# Patient Record
Sex: Male | Born: 1960 | Race: White | Hispanic: No | Marital: Married | Smoking: Never smoker
Health system: Southern US, Community
[De-identification: ages and names within clinical notes are randomized; demographics above are authoritative.]

## PROBLEM LIST (undated history)

## (undated) DIAGNOSIS — I10 Essential (primary) hypertension: Secondary | ICD-10-CM

## (undated) DIAGNOSIS — I2510 Atherosclerotic heart disease of native coronary artery without angina pectoris: Secondary | ICD-10-CM

## (undated) DIAGNOSIS — T7840XA Allergy, unspecified, initial encounter: Secondary | ICD-10-CM

## (undated) DIAGNOSIS — S62609A Fracture of unspecified phalanx of unspecified finger, initial encounter for closed fracture: Secondary | ICD-10-CM

## (undated) DIAGNOSIS — E785 Hyperlipidemia, unspecified: Secondary | ICD-10-CM

## (undated) HISTORY — PX: VASECTOMY: SHX75

## (undated) HISTORY — DX: Essential (primary) hypertension: I10

## (undated) HISTORY — DX: Atherosclerotic heart disease of native coronary artery without angina pectoris: I25.10

## (undated) HISTORY — DX: Allergy, unspecified, initial encounter: T78.40XA

## (undated) HISTORY — DX: Fracture of unspecified phalanx of unspecified finger, initial encounter for closed fracture: S62.609A

## (undated) HISTORY — PX: INGUINAL HERNIA REPAIR: SUR1180

## (undated) HISTORY — PX: COLONOSCOPY: SHX174

## (undated) HISTORY — DX: Hyperlipidemia, unspecified: E78.5

---

## 2012-10-05 ENCOUNTER — Encounter: Payer: Self-pay | Admitting: Internal Medicine

## 2012-11-30 ENCOUNTER — Ambulatory Visit (AMBULATORY_SURGERY_CENTER): Payer: Self-pay | Admitting: *Deleted

## 2012-11-30 VITALS — Ht 72.0 in | Wt 191.0 lb

## 2012-11-30 DIAGNOSIS — Z1211 Encounter for screening for malignant neoplasm of colon: Secondary | ICD-10-CM

## 2012-11-30 MED ORDER — NA SULFATE-K SULFATE-MG SULF 17.5-3.13-1.6 GM/177ML PO SOLN: ORAL | Status: DC

## 2012-11-30 NOTE — Progress Notes (Signed)
No egg or soy allergy 

## 2012-12-03 ENCOUNTER — Encounter: Payer: Self-pay | Admitting: Internal Medicine

## 2012-12-14 ENCOUNTER — Ambulatory Visit (AMBULATORY_SURGERY_CENTER): Payer: PRIVATE HEALTH INSURANCE | Admitting: Internal Medicine

## 2012-12-14 ENCOUNTER — Encounter: Payer: Self-pay | Admitting: Internal Medicine

## 2012-12-14 VITALS — BP 134/75 | HR 73 | Temp 97.3°F | Resp 27 | Ht 72.0 in | Wt 191.0 lb

## 2012-12-14 DIAGNOSIS — Z1211 Encounter for screening for malignant neoplasm of colon: Secondary | ICD-10-CM

## 2012-12-14 MED ORDER — SODIUM CHLORIDE 0.9 % IV SOLN: 500.0000 mL | INTRAVENOUS | Status: DC

## 2012-12-14 NOTE — Progress Notes (Addendum)
Patient did not have preoperative order for IV antibiotic SSI prophylaxis. (G8918)  Patient did not experience any of the following events: a burn prior to discharge; a fall within the facility; wrong site/side/patient/procedure/implant event; or a hospital transfer or hospital admission upon discharge from the facility. (G8907)  

## 2012-12-14 NOTE — Patient Instructions (Addendum)
The colonoscopy ws normal and the prep was excellent.  Next routine colonoscopy in 10 years - 2024. You do not need annual hemoccults.  I appreciate the opportunity to care for you. Iva Boop, MD, FACG  YOU HAD AN ENDOSCOPIC PROCEDURE TODAY AT THE [facility]: Refer to the procedure report that was given to you for any specific questions about what was found during the examination.  If the procedure report does not answer your questions, please call your gastroenterologist to clarify.  If you requested that your care partner not be given the details of your procedure findings, then the procedure report has been included in a sealed envelope for you to review at your convenience later.  YOU SHOULD EXPECT: Some feelings of bloating in the abdomen. Passage of more gas than usual.  Walking can help get rid of the air that was put into your GI tract during the procedure and reduce the bloating. If you had a lower endoscopy (such as a colonoscopy or flexible sigmoidoscopy) you may notice spotting of blood in your stool or on the toilet paper. If you underwent a bowel prep for your procedure, then you may not have a normal bowel movement for a few days.  DIET: Your first meal following the procedure should be a light meal and then it is ok to progress to your normal diet.  A half-sandwich or bowl of soup is an example of a good first meal.  Heavy or fried foods are harder to digest and may make you feel nauseous or bloated.  Likewise meals heavy in dairy and vegetables can cause extra gas to form and this can also increase the bloating.  Drink plenty of fluids but you should avoid alcoholic beverages for 24 hours.  ACTIVITY: Your care partner should take you home directly after the procedure.  You should plan to take it easy, moving slowly for the rest of the day.  You can resume normal activity the day after the procedure however you should NOT DRIVE or use heavy machinery for 24 hours  (because of the sedation medicines used during the test).    SYMPTOMS TO REPORT IMMEDIATELY: A gastroenterologist can be reached at any hour.  During normal business hours, 8:30 AM to 5:00 PM Monday through Friday, call [phone].  After hours and on weekends, please call the GI answering service at [phone]  Emergency number who will take a message and have the physician on call contact you.   Following lower endoscopy (colonoscopy or flexible sigmoidoscopy):  Excessive amounts of blood in the stool  Significant tenderness or worsening of abdominal pains  Swelling of the abdomen that is new, acute  Fever of 100F or higher  FOLLOW UP: If any biopsies were taken you will be contacted by phone or by letter within the next 1-3 weeks.  Call your gastroenterologist if you have not heard about the biopsies in 3 weeks.  Our staff will call the home number listed on your records the next business day following your procedure to check on you and address any questions or concerns that you may have at that time regarding the information given to you following your procedure. This is a courtesy call and so if there is no answer at the home number and we have not heard from you through the emergency physician on call, we will assume that you have returned to your regular daily activities without incident.  SIGNATURES/CONFIDENTIALITY: You and/or your care partner have  signed paperwork which will be entered into your electronic medical record.  These signatures attest to the fact that that the information above on your After Visit Summary has been reviewed and is understood.  Full responsibility of the confidentiality of this discharge information lies with you and/or your care-partner.

## 2012-12-14 NOTE — Op Note (Signed)
[facility] [address] [place], [address]   COLONOSCOPY PROCEDURE REPORT  PATIENT: Eric, Dixon  MR#: [id number] BIRTHDATE: 1960/09/02 , 51  yrs. old GENDER: Male ENDOSCOPIST: Iva Boop, MD, [facility] REFERRED BM:WUXL Timothy Lasso, M.D. PROCEDURE DATE:  12/14/2012 PROCEDURE:   Colonoscopy, screening First Screening Colonoscopy - Avg.  risk and is 50 yrs.  old or older Yes.  Prior Negative Screening - Now for repeat screening. N/A  History of Adenoma - Now for follow-up colonoscopy & has been > or = to 3 yrs.  N/A  Polyps Removed Today? No.  Recommend repeat exam, <10 yrs? No. ASA CLASS:   Class I INDICATIONS:average risk screening and first colonoscopy. MEDICATIONS: propofol (Diprivan) 400mg  IV, MAC sedation, administered by CRNA, and These medications were titrated to patient response per physician's verbal order  DESCRIPTION OF PROCEDURE:   After the risks benefits and alternatives of the procedure were thoroughly explained, informed consent was obtained.  A digital rectal exam revealed no abnormalities of the rectum, A digital rectal exam revealed no prostatic nodules, and A digital rectal exam revealed the prostate was not enlarged.   The LB [id number]  endoscope was introduced through the anus and advanced to the cecum, which was identified by both the appendix and ileocecal valve. No adverse events experienced.   The quality of the prep was excellent using Suprep  The instrument was then slowly withdrawn as the colon was fully examined.   COLON FINDINGS: A normal appearing cecum, ileocecal valve, and appendiceal orifice were identified.  The ascending, hepatic flexure, transverse, splenic flexure, descending, sigmoid colon and rectum appeared unremarkable.  No polyps or cancers were seen.   A right colon retroflexion was performed.  Retroflexed views revealed no abnormalities. The time to cecum=3 minutes 33 seconds. Withdrawal time=8 minutes 55  seconds.  The scope was withdrawn and the procedure completed. COMPLICATIONS: There were no complications.  ENDOSCOPIC IMPRESSION: Normal colonoscopy - excellent prep - first exam  RECOMMENDATIONS: 1.  Repeat routine colonoscopy 10 years (2024) 2.   Does not need annual hemoccults in the interim.   eSigned:  Iva Boop, MD, [facility] 12/14/2012 11:59 AM   cc: Creola Corn, MD and The Patient   PATIENT NAME:  Eric, Dixon MR#: [id number]

## 2012-12-15 ENCOUNTER — Telehealth: Payer: Self-pay | Admitting: *Deleted

## 2012-12-15 NOTE — Telephone Encounter (Signed)
  Follow up Call-  Call back number 12/14/2012  Post procedure Call Back phone  # [phone]  Permission to leave phone message Yes     Patient questions:  Do you have a fever, pain , or abdominal swelling? no Pain Score  0 *  Have you tolerated food without any problems? yes  Have you been able to return to your normal activities? yes  Do you have any questions about your discharge instructions: Diet   no Medications  no Follow up visit  no  Do you have questions or concerns about your Care? no  Actions: * If pain score is 4 or above: No action needed, pain <4.

## 2018-11-11 ENCOUNTER — Other Ambulatory Visit: Payer: Self-pay

## 2018-11-11 DIAGNOSIS — Z20822 Contact with and (suspected) exposure to covid-19: Secondary | ICD-10-CM

## 2018-11-12 LAB — NOVEL CORONAVIRUS, NAA: SARS-CoV-2, NAA: NOT DETECTED

## 2019-11-20 ENCOUNTER — Ambulatory Visit: Payer: Self-pay | Attending: Internal Medicine

## 2019-11-20 ENCOUNTER — Other Ambulatory Visit: Payer: Self-pay

## 2019-11-20 DIAGNOSIS — Z23 Encounter for immunization: Secondary | ICD-10-CM

## 2019-11-20 NOTE — Progress Notes (Signed)
   Covid-19 Vaccination Clinic  Name:  Eric Dixon    MRN: [id number] DOB: Feb 17, 1961  11/20/2019  [name] was observed post Covid-19 immunization for 15 minutes without incident. He was provided with Vaccine Information Sheet and instruction to access the V-Safe system.   [name] was instructed to call 911 with any severe reactions post vaccine: Marland Kitchen Difficulty breathing  . Swelling of face and throat  . A fast heartbeat  . A bad rash all over body  . Dizziness and weakness

## 2020-05-08 ENCOUNTER — Other Ambulatory Visit: Payer: Self-pay | Admitting: Internal Medicine

## 2020-05-08 DIAGNOSIS — Z125 Encounter for screening for malignant neoplasm of prostate: Secondary | ICD-10-CM | POA: Diagnosis not present

## 2020-05-08 DIAGNOSIS — E785 Hyperlipidemia, unspecified: Secondary | ICD-10-CM

## 2020-05-08 DIAGNOSIS — Z0000 Encounter for general adult medical examination without abnormal findings: Secondary | ICD-10-CM | POA: Diagnosis not present

## 2020-05-17 ENCOUNTER — Ambulatory Visit
Admission: RE | Admit: 2020-05-17 | Discharge: 2020-05-17 | Disposition: A | Discharge status: Home / Self Care | Payer: No Typology Code available for payment source | Source: Ambulatory Visit | Attending: Internal Medicine | Admitting: Internal Medicine

## 2020-05-17 DIAGNOSIS — E785 Hyperlipidemia, unspecified: Secondary | ICD-10-CM

## 2020-07-17 ENCOUNTER — Other Ambulatory Visit: Payer: Self-pay

## 2020-07-17 ENCOUNTER — Ambulatory Visit (INDEPENDENT_AMBULATORY_CARE_PROVIDER_SITE_OTHER): Payer: BC Managed Care – PPO | Admitting: Cardiology

## 2020-07-17 VITALS — BP 130/70 | HR 80 | Ht 72.0 in | Wt 200.0 lb

## 2020-07-17 DIAGNOSIS — Z79899 Other long term (current) drug therapy: Secondary | ICD-10-CM | POA: Diagnosis not present

## 2020-07-17 DIAGNOSIS — R931 Abnormal findings on diagnostic imaging of heart and coronary circulation: Secondary | ICD-10-CM

## 2020-07-17 MED ORDER — ROSUVASTATIN CALCIUM 20 MG PO TABS: 20.0000 mg | ORAL_TABLET | Freq: Every day | ORAL | 3 refills | Status: DC

## 2020-07-17 MED ORDER — METOPROLOL TARTRATE 100 MG PO TABS: 100.0000 mg | ORAL_TABLET | Freq: Once | ORAL | 0 refills | Status: DC

## 2020-07-17 MED ORDER — ASPIRIN EC 81 MG PO TBEC: 81.0000 mg | DELAYED_RELEASE_TABLET | Freq: Every day | ORAL | 3 refills | Status: DC

## 2020-07-17 NOTE — Patient Instructions (Signed)
Medication Instructions:  Please start Aspirin 81 mg a day. Increase Crestor to 20 mg daily. Continue any other medications as listed.  *If you need a refill on your cardiac medications before your next appointment, please call your pharmacy*  Lab:  Please have blood work in 3 months (Lipid, ALT)  Testing/Procedures: Your cardiac CT will be scheduled at:   [facility] [address] [place], [place] [address] [phone]  Please arrive at the [facility] main entrance (entrance A) of [facility] 30 minutes prior to test start time. Proceed to the [facility] Radiology Department (first floor) to check-in and test prep.  Please follow these instructions carefully (unless otherwise directed):  Hold all erectile dysfunction medications at least 3 days (72 hrs) prior to test.  On the Night Before the Test: . Be sure to Drink plenty of water. . Do not consume any caffeinated/decaffeinated beverages or chocolate 12 hours prior to your test. . Do not take any antihistamines 12 hours prior to your test.  On the Day of the Test: . Drink plenty of water until 1 hour prior to the test. . Do not eat any food 4 hours prior to the test. . You may take your regular medications prior to the test.  . Take metoprolol (Lopressor) two hours prior to test. . HOLD Furosemide/Hydrochlorothiazide morning of the test.  After the Test: . Drink plenty of water. . After receiving IV contrast, you may experience a mild flushed feeling. This is normal. . On occasion, you may experience a mild rash up to 24 hours after the test. This is not dangerous. If this occurs, you can take Benadryl 25 mg and increase your fluid intake. . If you experience trouble breathing, this can be serious. If it is severe call 911 IMMEDIATELY. If it is mild, please call our office.  Once we have confirmed authorization from your insurance company, we will call you to set up a date and time for your test.  Based on how quickly your insurance processes prior authorizations requests, please allow up to 4 weeks to be contacted for scheduling your Cardiac CT appointment. Be advised that routine Cardiac CT appointments could be scheduled as many as 8 weeks after your provider has ordered it.  For non-scheduling related questions, please contact the cardiac imaging nurse navigator should you have any questions/concerns: Rockwell Alexandria, Cardiac Imaging Nurse Navigator Larey Brick, Cardiac Imaging Nurse Navigator [facility] Direct Office Dial: [phone]   For scheduling needs, including cancellations and rescheduling, please call [place], [phone].   Follow-Up: At [facility], you and your health needs are our priority.  As part of our continuing mission to provide you with exceptional heart care, we have created designated Provider Care Teams.  These Care Teams include your primary Cardiologist (physician) and Advanced Practice Providers (APPs -  Physician Assistants and Nurse Practitioners) who all work together to provide you with the care you need, when you need it.  We recommend signing up for the patient portal called "MyChart".  Sign up information is provided on this After Visit Summary.  MyChart is used to connect with patients for Virtual Visits (Telemedicine).  Patients are able to view lab/test results, encounter notes, upcoming appointments, etc.  Non-urgent messages can be sent to your provider as well.   To learn more about what you can do with MyChart, go to [email or web address].    Your next appointment:   12 month(s)  The format  for your next appointment:   In Person  Provider:   Donato Schultz, MD   Thank you for choosing [facility]!!

## 2020-07-17 NOTE — Progress Notes (Signed)
Cardiology Office Note:    Date:  07/17/2020   ID:  Eric Dixon, DOB 03/22/1960, MRN [id number]  PCP:  Eric Dixon, John, MD   [facility] Providers Cardiologist:  Eric SchultzMark Christinamarie Tall, MD     Referring MD: Eric Dixon, John, MD    History of Present Illness:    Eric Dixon is a 60 y.o. male evaluation of coronary artery calcification seen on calcium score at the request of Dr. Timothy Dixon.  Calcium score was 816.  RCA was likely over calculated secondary to motion artifact. He was started on rosuvastatin 10 mg a day by Dr. Timothy Dixon.  No myalgias.  Not having any symptoms such as chest pain fevers chills nausea vomiting syncope bleeding shortness of breath.  Recently performed hiking without any difficulty.  Tc 200-240.  HDL has been high.   Tc 157, HDL 53, LDL 83, Trig 115 - Non fasting. Post Crestor.   Mother 185 AFIB pacer.   Father's father died 7267 of MI.   Radiologist.  Past Medical History:  Diagnosis Date  . Allergy   . CAD (coronary artery disease)   . Fracture of finger   . Hyperlipidemia   . Hypertension     Past Surgical History:  Procedure Laterality Date  . INGUINAL HERNIA REPAIR    . VASECTOMY      Current Medications: Current Meds  Medication Sig  . aspirin EC 81 MG tablet Take 1 tablet (81 mg total) by mouth daily. Swallow whole.  . IBUPROFEN PO Take by mouth as needed.  Marland Kitchen. MELATONIN PO Take by mouth as needed.  . metoprolol tartrate (LOPRESSOR) 100 MG tablet Take 1 tablet (100 mg total) by mouth once for 1 dose. Take 1 tablet 2 hours before your CT scan  . rosuvastatin (CRESTOR) 20 MG tablet Take 1 tablet (20 mg total) by mouth daily.  . [DISCONTINUED] rosuvastatin (CRESTOR) 10 MG tablet Take 10 mg by mouth daily.     Allergies:   Patient has no known allergies.   Social History   Socioeconomic History  . Marital status: Married    Spouse name: Not on file  . Number of children: Not on file  . Years of education: Not on file  . Highest education level: Not  on file  Occupational History  . Not on file  Tobacco Use  . Smoking status: Never Smoker  . Smokeless tobacco: Never Used  Substance and Sexual Activity  . Alcohol use: Yes    Comment: socially  . Drug use: No  . Sexual activity: Not on file  Other Topics Concern  . Not on file  Social History Narrative  . Not on file   Social Determinants of Health   Financial Resource Strain: Not on file  Food Insecurity: Not on file  Transportation Needs: Not on file  Physical Activity: Not on file  Stress: Not on file  Social Connections: Not on file     Family History: The patient's family history includes Other in his brother and father; Parkinson's disease in his father. There is no history of Colon cancer, Rectal cancer, Stomach cancer, or Esophageal cancer.  ROS:   Please see the history of present illness.     All other systems reviewed and are negative.  EKGs/Labs/Other Studies Reviewed:    The following studies were reviewed today:   CT calcium score 05/17/2020:  FINDINGS: CORONARY CALCIUM SCORES:  Left Main: 0  LAD: 176-calcification within the proximal and mid LAD.  LCx: 1.6  RCA: 633-calcifications  within the proximal, mid, and distal right circumflex.  Total Agatston Score: 816  MESA database percentile: 97th  AORTA MEASUREMENTS:  Ascending Aorta: 32 mm  Descending Aorta: 28 mm  OTHER FINDINGS:  Cardiovascular: Normal aortic caliber. Normal heart size, without pericardial effusion.  Mediastinum/Nodes: No imaged thoracic adenopathy.  Lungs/Pleura: No pleural fluid.  Clear imaged lungs.  Upper Abdomen: Normal imaged portions of the liver, spleen, stomach.  Musculoskeletal: No acute osseous abnormality.  IMPRESSION: 1. Total Agatston score of 816, corresponding to 97th percentile for age, sex, and race based cohort. 2. No acute process in the imaged chest.   Electronically Signed   By: Eric Dixon M.D.  EKG:  EKG is   ordered today.  The ekg ordered today demonstrates sinus rhythm 73 with no other abnormalities.  Recent Labs: No results found for requested labs within last 8760 hours.  Recent Lipid Panel No results found for: CHOL, TRIG, HDL, CHOLHDL, VLDL, LDLCALC, LDLDIRECT    Physical Exam:    VS:  BP 130/70 (BP Location: Left Arm, Patient Position: Sitting, Cuff Size: Normal)   Pulse 80   Ht 6' (1.829 m)   Wt 200 lb (90.7 kg)   SpO2 98%   BMI 27.12 kg/m     Wt Readings from Last 3 Encounters:  07/17/20 200 lb (90.7 kg)  12/14/12 191 lb (86.6 kg)  11/30/12 191 lb (86.6 kg)     GEN:  Well nourished, well developed in no acute distress HEENT: Normal NECK: No JVD; No carotid bruits LYMPHATICS: No lymphadenopathy CARDIAC: RRR, no murmurs, rubs, gallops RESPIRATORY:  Clear to auscultation without rales, wheezing or rhonchi  ABDOMEN: Soft, non-tender, non-distended MUSCULOSKELETAL:  No edema; No deformity  SKIN: Warm and dry NEUROLOGIC:  Alert and oriented x 3 PSYCHIATRIC:  Normal affect   ASSESSMENT:    1. Elevated coronary artery calcium score   2. Medication management    PLAN:    In order of problems listed above:  Coronary artery atherosclerosis/coronary artery calcification/hyperlipidemia - Personally reviewed his CT scan, there is some motion artifact of his RCA which will amplify the calcium score.  Nonetheless, we will go ahead and intensify his statin therapy to 20 mg a day.  He is tolerating his 10 mg well.  No myalgias.  We will recheck a lipid panel and ALT in 3 months. - Since his CT scan is abnormal showing elevated calcium score, coronary atherosclerosis, we will go ahead and perform a coronary CT scan with possible FFR analysis.  Due to the current shortage and Omnipaque distribution, I am fine delaying the scan until appropriate as an outpatient.  He is aware of this. -We will start aspirin 81 mg a day.  Watch for any signs of bleeding. -He has 3 children.  Make  sure that their cholesterol levels are checked and consider coronary calcium scores when they approach 59 years old potentially.   We will see him back in 1 year.  Medication Adjustments/Labs and Tests Ordered: Current medicines are reviewed at length with the patient today.  Concerns regarding medicines are outlined above.  Orders Placed This Encounter  Procedures  . CT CORONARY MORPH W/CTA COR W/SCORE W/CA W/CM &/OR WO/CM  . ALT  . Lipid panel  . EKG 12-Lead   Meds ordered this encounter  Medications  . rosuvastatin (CRESTOR) 20 MG tablet    Sig: Take 1 tablet (20 mg total) by mouth daily.    Dispense:  90 tablet    Refill:  3  . aspirin EC 81 MG tablet    Sig: Take 1 tablet (81 mg total) by mouth daily. Swallow whole.    Dispense:  90 tablet    Refill:  3  . metoprolol tartrate (LOPRESSOR) 100 MG tablet    Sig: Take 1 tablet (100 mg total) by mouth once for 1 dose. Take 1 tablet 2 hours before your CT scan    Dispense:  1 tablet    Refill:  0    Patient Instructions  Medication Instructions:  Please start Aspirin 81 mg a day. Increase Crestor to 20 mg daily. Continue any other medications as listed.  *If you need a refill on your cardiac medications before your next appointment, please call your pharmacy*  Lab:  Please have blood work in 3 months (Lipid, ALT)  Testing/Procedures: Your cardiac CT will be scheduled at:   [facility] [address] [place], [place] [address] [phone]  Please arrive at the [facility] main entrance (entrance A) of [facility] 30 minutes prior to test start time. Proceed to the [facility] Radiology Department (first floor) to check-in and test prep.  Please follow these instructions carefully (unless otherwise directed):  Hold all erectile dysfunction medications at least 3 days (72 hrs) prior to test.  On the Night Before the Test: . Be sure to Drink plenty of water. . Do not consume any  caffeinated/decaffeinated beverages or chocolate 12 hours prior to your test. . Do not take any antihistamines 12 hours prior to your test.  On the Day of the Test: . Drink plenty of water until 1 hour prior to the test. . Do not eat any food 4 hours prior to the test. . You may take your regular medications prior to the test.  . Take metoprolol (Lopressor) two hours prior to test. . HOLD Furosemide/Hydrochlorothiazide morning of the test.  After the Test: . Drink plenty of water. . After receiving IV contrast, you may experience a mild flushed feeling. This is normal. . On occasion, you may experience a mild rash up to 24 hours after the test. This is not dangerous. If this occurs, you can take Benadryl 25 mg and increase your fluid intake. . If you experience trouble breathing, this can be serious. If it is severe call 911 IMMEDIATELY. If it is mild, please call our office.  Once we have confirmed authorization from your insurance company, we will call you to set up a date and time for your test. Based on how quickly your insurance processes prior authorizations requests, please allow up to 4 weeks to be contacted for scheduling your Cardiac CT appointment. Be advised that routine Cardiac CT appointments could be scheduled as many as 8 weeks after your provider has ordered it.  For non-scheduling related questions, please contact the cardiac imaging nurse navigator should you have any questions/concerns: Rockwell Alexandria, Cardiac Imaging Nurse Navigator Larey Brick, Cardiac Imaging Nurse Navigator [facility] Direct Office Dial: [phone]   For scheduling needs, including cancellations and rescheduling, please call [place], [phone].   Follow-Up: At [facility], you and your health needs are our priority.  As part of our continuing mission to provide you with exceptional heart care, we have created designated Provider Care Teams.  These Care Teams  include your primary Cardiologist (physician) and Advanced Practice Providers (APPs -  Physician Assistants and Nurse Practitioners) who all work together to provide you with the care you need, when  you need it.  We recommend signing up for the patient portal called "MyChart".  Sign up information is provided on this After Visit Summary.  MyChart is used to connect with patients for Virtual Visits (Telemedicine).  Patients are able to view lab/test results, encounter notes, upcoming appointments, etc.  Non-urgent messages can be sent to your provider as well.   To learn more about what you can do with MyChart, go to [email or web address].    Your next appointment:   12 month(s)  The format for your next appointment:   In Person  Provider:   Donato Schultz, MD   Thank you for choosing [facility]!!         Signed, Eric Schultz, MD  07/17/2020 10:47 AM    [facility]

## 2020-08-20 ENCOUNTER — Telehealth (HOSPITAL_COMMUNITY): Payer: Self-pay | Admitting: *Deleted

## 2020-08-20 ENCOUNTER — Telehealth (HOSPITAL_COMMUNITY): Payer: Self-pay | Admitting: Emergency Medicine

## 2020-08-20 NOTE — Telephone Encounter (Signed)
Pt returning phone call regarding upcoming cardiac imaging study; pt verbalizes understanding of appt date/time, parking situation and where to check in, pre-test NPO status and medications ordered, and verified current allergies; name and call back number provided for further questions should they arise Rockwell Alexandria RN Navigator Cardiac Imaging [facility] [phone] office [phone] cell   100mg  metoprolol tartrate PTA

## 2020-08-20 NOTE — Telephone Encounter (Signed)
Attempted to call patient regarding upcoming cardiac CT appointment. °Left message on voicemail with name and callback number ° °Jaheim Canino RN Navigator Cardiac Imaging °[facility] °[phone] Office °[phone] Cell ° °

## 2020-08-22 ENCOUNTER — Other Ambulatory Visit (HOSPITAL_COMMUNITY): Payer: Self-pay | Admitting: Cardiology

## 2020-08-22 ENCOUNTER — Other Ambulatory Visit (HOSPITAL_COMMUNITY): Payer: Self-pay | Admitting: *Deleted

## 2020-08-22 ENCOUNTER — Other Ambulatory Visit: Payer: Self-pay

## 2020-08-22 ENCOUNTER — Ambulatory Visit (HOSPITAL_COMMUNITY)
Admission: RE | Admit: 2020-08-22 | Discharge: 2020-08-22 | Disposition: A | Discharge status: Home / Self Care | Payer: BC Managed Care – PPO | Source: Ambulatory Visit | Attending: Cardiology | Admitting: Cardiology

## 2020-08-22 ENCOUNTER — Encounter (HOSPITAL_COMMUNITY): Payer: Self-pay

## 2020-08-22 DIAGNOSIS — R931 Abnormal findings on diagnostic imaging of heart and coronary circulation: Secondary | ICD-10-CM

## 2020-08-22 DIAGNOSIS — I2510 Atherosclerotic heart disease of native coronary artery without angina pectoris: Secondary | ICD-10-CM | POA: Diagnosis not present

## 2020-08-22 IMAGING — CT CT HEART MORP W/ CTA COR W/ SCORE W/ CA W/CM &/OR W/O CM
1 of 8 series · 3 of 20 positions shown, 4 images · IV contrast (APPLIED)
Comparison: None.
COMPARISON: None.

Addendum:
EXAM:
OVER-READ INTERPRETATION  CT CHEST

The following report is an over-read performed by radiologist Dr.
Fermina Zachariah [REDACTED] on 08/22/2020. This
over-read does not include interpretation of cardiac or coronary
anatomy or pathology. The coronary CTA interpretation by the
cardiologist is attached.
TECHNIQUE: The patient was scanned on a Phillips Force scanner.

[Series 12: best syst · axial · 0.39mm/px · z∈[+1070,+1214]mm · 3 of 361 slices shown, 4 images]
[im 1/361  vessel]
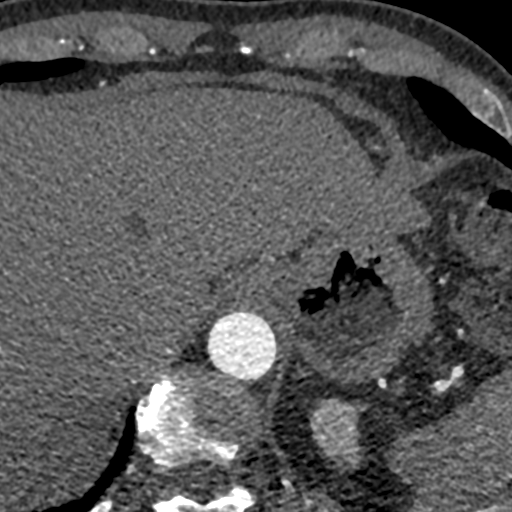
[im 1/361  lung]
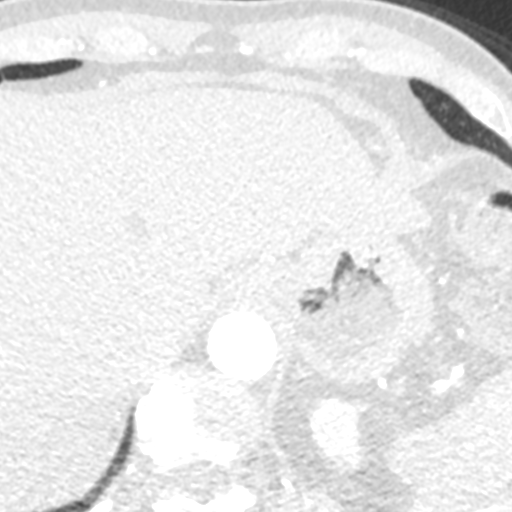
[im 181/361  vessel]
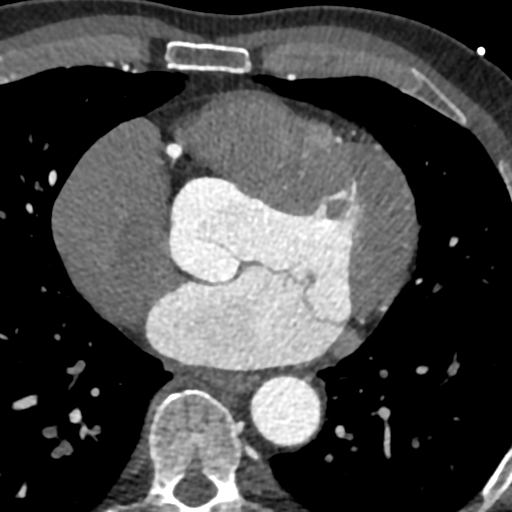
[im 361/361  vessel]
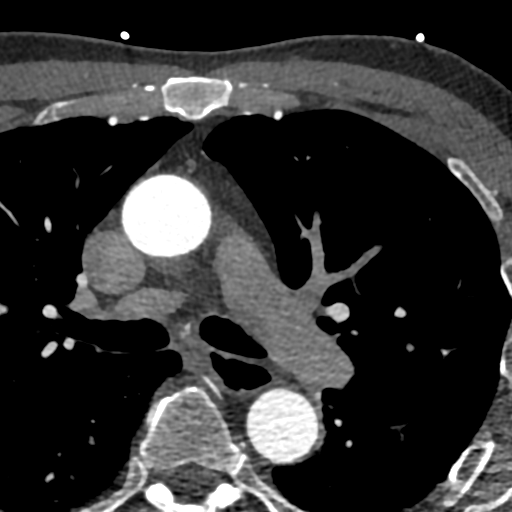

[3 of 20 positions shown; findings below may reference images not displayed]

FINDINGS: Within the visualized portions of the thorax there are no suspicious
appearing pulmonary nodules or masses, there is no acute
consolidative airspace disease, no pleural effusions, no
pneumothorax and no lymphadenopathy. Visualized portions of the
upper abdomen are unremarkable. There are no aggressive appearing
lytic or blastic lesions noted in the visualized portions of the
skeleton.
IMPRESSION: No significant incidental noncardiac findings are noted.

EXAM:
Cardiac/Coronary  CT
FINDINGS: A 120 kV prospective scan was triggered in the descending thoracic
aorta at 111 HU's. Axial non-contrast 3 mm slices were carried out
through the heart. The data set was analyzed on a dedicated work
station and scored using the Agatson method. Gantry rotation speed
was 250 msecs and collimation was .6 mm. No beta blockade and 0.8 mg
of sl NTG was given. The 3D data set was reconstructed in 5%
intervals of the 67-82 % of the R-R cycle. Diastolic phases were
analyzed on a dedicated work station using MPR, MIP and VRT modes.
The patient received 80 cc of contrast.

Aorta:  Normal size.  No calcifications.  No dissection.

Aortic Valve:  Trileaflet.  No calcifications.

Coronary Arteries:  Normal coronary origin.  Right dominance.

RCA is a large dominant artery that gives rise to PDA and PLVB.
There is mild calcified plaque in the proxmal RCA wtih associated
stenosis of 25-49% There is minimal calcified plaque in the mid and
distal RCA with associated stenosis of <25%.

Left main is a large artery that gives rise to LAD, Ramus and LCX
arteries. There is no plaque.

Ramus is a moderate sized vessel with mild calcified plaque
throughout the vessel with associated stenosis of 25-49%.

LAD is a large vessel that gives rise to 2 moderate sized diagonal
branches. There is mild calcified plaque in the proximal and mid LAD
with associated stenosis of 25-49%.

LCX is a non-dominant artery that gives rise to one large OM1
branch. There is no plaque.

Other findings:

Normal pulmonary vein drainage into the left atrium.

Normal let atrial appendage without a thrombus.

Normal size of the pulmonary artery.
IMPRESSION: 1. Coronary calcium score of 622. This was 95th percentile for age
and sex matched control.

2.  Mild atherosclerosis.  CAD RADS 2.

3.  Consider non atherosclerotic causes of chest pain.

4.  Recommend preventive therapy and risk factor modification.

5.  This study has been submitted for FFR flow analysis.

Alia Tiger

*** End of Addendum ***
EXAM:
OVER-READ INTERPRETATION  CT CHEST

The following report is an over-read performed by radiologist Dr.
Fermina Zachariah [REDACTED] on 08/22/2020. This
over-read does not include interpretation of cardiac or coronary
anatomy or pathology. The coronary CTA interpretation by the
cardiologist is attached.
FINDINGS: Within the visualized portions of the thorax there are no suspicious
appearing pulmonary nodules or masses, there is no acute
consolidative airspace disease, no pleural effusions, no
pneumothorax and no lymphadenopathy. Visualized portions of the
upper abdomen are unremarkable. There are no aggressive appearing
lytic or blastic lesions noted in the visualized portions of the
skeleton.
IMPRESSION: No significant incidental noncardiac findings are noted.

## 2020-08-22 MED ORDER — IOHEXOL 350 MG/ML SOLN
95.0000 mL | Freq: Once | INTRAVENOUS | Status: AC | PRN
  Administered 2020-08-22: 95 mL via INTRAVENOUS

## 2020-08-22 MED ORDER — NITROGLYCERIN 0.4 MG SL SUBL
SUBLINGUAL_TABLET | SUBLINGUAL | Status: AC
  Filled 2020-08-22: qty 2

## 2020-08-22 MED ORDER — NITROGLYCERIN 0.4 MG SL SUBL
0.8000 mg | SUBLINGUAL_TABLET | Freq: Once | SUBLINGUAL | Status: AC
  Administered 2020-08-22: 0.8 mg via SUBLINGUAL

## 2020-10-30 ENCOUNTER — Other Ambulatory Visit: Payer: BC Managed Care – PPO

## 2020-10-31 ENCOUNTER — Other Ambulatory Visit: Payer: BC Managed Care – PPO | Admitting: *Deleted

## 2020-10-31 ENCOUNTER — Other Ambulatory Visit: Payer: Self-pay

## 2020-10-31 DIAGNOSIS — R931 Abnormal findings on diagnostic imaging of heart and coronary circulation: Secondary | ICD-10-CM | POA: Diagnosis not present

## 2020-10-31 DIAGNOSIS — Z79899 Other long term (current) drug therapy: Secondary | ICD-10-CM | POA: Diagnosis not present

## 2020-10-31 LAB — LIPID PANEL
Chol/HDL Ratio: 2.5 ratio (ref 0.0–5.0)
Cholesterol, Total: 144 mg/dL (ref 100–199)
HDL: 57 mg/dL (ref >39)
LDL Chol Calc (NIH): 73 mg/dL (ref 0–99)
Triglycerides: 67 mg/dL (ref 0–149)
VLDL Cholesterol Cal: 14 mg/dL (ref 5–40)

## 2020-10-31 LAB — ALT: ALT: 34 IU/L (ref 0–44)

## 2020-11-09 ENCOUNTER — Telehealth: Payer: Self-pay | Admitting: *Deleted

## 2020-11-09 DIAGNOSIS — Z79899 Other long term (current) drug therapy: Secondary | ICD-10-CM

## 2020-11-09 DIAGNOSIS — R931 Abnormal findings on diagnostic imaging of heart and coronary circulation: Secondary | ICD-10-CM

## 2020-11-09 NOTE — Telephone Encounter (Signed)
Eric Bathe, MD  11/01/2020  8:12 AM EDT     LDL 73. ALT 34, normal liver function. Continue with Crestor 20mg  PO QD with diet and exercise. Close to goal of <70 LDL.  Could consider addition of Zetia 10mg  PO QD or increasing Crestor to 40 mg. Repeat Lipid panel in 6 months. , MD    Lab ordered and scheduled.  Message sent to pt via MyChart of Dr comments, recommendations and appt date.

## 2021-05-09 ENCOUNTER — Other Ambulatory Visit: Payer: BC Managed Care – PPO

## 2021-05-09 ENCOUNTER — Other Ambulatory Visit: Payer: Self-pay

## 2021-05-09 DIAGNOSIS — R931 Abnormal findings on diagnostic imaging of heart and coronary circulation: Secondary | ICD-10-CM | POA: Diagnosis not present

## 2021-05-09 DIAGNOSIS — Z79899 Other long term (current) drug therapy: Secondary | ICD-10-CM | POA: Diagnosis not present

## 2021-05-09 LAB — LIPID PANEL
Chol/HDL Ratio: 3.1 ratio (ref 0.0–5.0)
Cholesterol, Total: 176 mg/dL (ref 100–199)
HDL: 56 mg/dL (ref >39)
LDL Chol Calc (NIH): 102 mg/dL — ABNORMAL HIGH (ref 0–99)
Triglycerides: 100 mg/dL (ref 0–149)
VLDL Cholesterol Cal: 18 mg/dL (ref 5–40)

## 2021-05-15 ENCOUNTER — Encounter: Payer: Self-pay | Admitting: Cardiology

## 2021-05-15 ENCOUNTER — Other Ambulatory Visit: Payer: Self-pay | Admitting: *Deleted

## 2021-05-15 MED ORDER — EZETIMIBE 10 MG PO TABS: 10.0000 mg | ORAL_TABLET | Freq: Every day | ORAL | 3 refills | Status: DC

## 2021-05-15 NOTE — Progress Notes (Signed)
Jake Bathe, MD  ?05/13/2021  5:11 PM EST Back to Top  ?  ?LDL 102.  Lets make sure that he is taking the Crestor 20 mg. ?If he is, lets add Zetia 10 mg to the Crestor to see if we can reach our goal of less than 70.   ? ?RX sent into pharmacy of pt's choice.   ?

## 2021-06-29 ENCOUNTER — Encounter: Payer: Self-pay | Admitting: Cardiology

## 2021-07-18 ENCOUNTER — Encounter: Payer: Self-pay | Admitting: Cardiology

## 2021-07-18 ENCOUNTER — Other Ambulatory Visit: Payer: Self-pay | Admitting: Cardiology

## 2021-07-18 MED ORDER — ROSUVASTATIN CALCIUM 20 MG PO TABS: ORAL_TABLET | ORAL | 1 refills | Status: DC

## 2021-12-03 ENCOUNTER — Encounter: Payer: Self-pay | Admitting: Cardiology

## 2021-12-03 ENCOUNTER — Ambulatory Visit: Payer: BC Managed Care – PPO | Attending: Cardiology | Admitting: Cardiology

## 2021-12-03 VITALS — BP 120/80 | HR 76 | Ht 72.0 in | Wt 200.0 lb

## 2021-12-03 DIAGNOSIS — R931 Abnormal findings on diagnostic imaging of heart and coronary circulation: Secondary | ICD-10-CM | POA: Diagnosis not present

## 2021-12-03 DIAGNOSIS — Z79899 Other long term (current) drug therapy: Secondary | ICD-10-CM | POA: Diagnosis not present

## 2021-12-03 MED ORDER — ROSUVASTATIN CALCIUM 20 MG PO TABS: ORAL_TABLET | ORAL | 3 refills | Status: DC

## 2021-12-03 NOTE — Progress Notes (Addendum)
Cardiology Office Note:    Date:  12/03/2021   ID:  Eric Dixon, DOB Oct 12, 1960, MRN [id number]  PCP:  Creola Corn, MD   [facility] Providers Cardiologist:  Donato Schultz, MD     Referring MD: Creola Corn, MD    History of Present Illness:    Eric Dixon is a 61 y.o. male here for follow up of CAD nonflow-limiting on FFR analysis.   He was originally seen on 07/18/2020 for evaluation of coronary artery calcification seen on calcium score at the request of Dr. Timothy Lasso. At that visit his Calcium score was 816.  RCA was likely over calculated secondary to motion artifact. He was started on rosuvastatin 10 mg a day by Dr. Timothy Lasso.  No myalgias.  Not having any symptoms such as chest pain fevers chills nausea vomiting syncope bleeding shortness of breath. He reported hiking without any difficulty   Labs at that time showed:  Tc 200-240.  HDL has been high.   Tc 157, HDL 53, LDL 83, Trig 115 - Non fasting. Post Crestor.   He has a family history with his Mother 60 AFIB pacer and his father's father died 52 of MI.   He had subsequent coronary CT scan with contrast 08/22/2020 with a score of 622 and FFR showing no flow-limiting disease.   Today he reports he is doing well and he is feeling good. He is tolerating the Crestor well, no issues taking the medication. The LDL was 102 when he was still on the Crestor 10 mg. When he went back on the Crestor 20 mg combine with the Zetia 10 mg, his LDL had gone back down, but he was not sure of the exact number.  Labs were performed during doctors today.  He is still taking a daily aspirin.   He denies any palpitations, chest pain, shortness of breath, or peripheral edema. No lightheadedness, headaches, syncope, orthopnea, or PND.   He is going up to [place] to visit his daughter who works in city planning.   Past Medical History:  Diagnosis Date   Allergy    CAD (coronary artery disease)    Fracture of finger    Hyperlipidemia     Hypertension     Past Surgical History:  Procedure Laterality Date   INGUINAL HERNIA REPAIR     VASECTOMY      Current Medications: Current Meds  Medication Sig   aspirin EC 81 MG tablet Take 1 tablet (81 mg total) by mouth daily. Swallow whole.   ezetimibe (ZETIA) 10 MG tablet Take 1 tablet (10 mg total) by mouth daily.   IBUPROFEN PO Take by mouth as needed.   MELATONIN PO Take by mouth as needed.   [DISCONTINUED] rosuvastatin (CRESTOR) 20 MG tablet TAKE 1 TABLET(20 MG) BY MOUTH DAILY     Allergies:   Patient has no known allergies.   Social History   Socioeconomic History   Marital status: Married    Spouse name: Not on file   Number of children: Not on file   Years of education: Not on file   Highest education level: Not on file  Occupational History   Not on file  Tobacco Use   Smoking status: Never   Smokeless tobacco: Never  Substance and Sexual Activity   Alcohol use: Yes    Comment: socially   Drug use: No   Sexual activity: Not on file  Other Topics Concern   Not on file  Social History Narrative   Not  on file   Social Determinants of Health   Financial Resource Strain: Not on file  Food Insecurity: Not on file  Transportation Needs: Not on file  Physical Activity: Not on file  Stress: Not on file  Social Connections: Not on file     Family History: The patient's family history includes Other in his brother and father; Parkinson's disease in his father. There is no history of Colon cancer, Rectal cancer, Stomach cancer, or Esophageal cancer.  ROS:   Please see the history of present illness.     All other systems reviewed and are negative.  EKGs/Labs/Other Studies Reviewed:    The following studies were reviewed today:  CT Calcium Scoring 08/22/2020: IMPRESSION: 1. Coronary calcium score of 622. This was 95th percentile for age and sex matched control.   2.  Mild atherosclerosis.  CAD RADS 2.   3.  Consider non atherosclerotic causes of  chest pain.   4.  Recommend preventive therapy and risk factor modification.   5.  This study has been submitted for FFR flow analysis.    Coronary Fractional Flow 08/22/2020: FINDINGS: FFRct analysis was performed on the original cardiac CT angiogram dataset. Diagrammatic representation of the FFRct analysis is provided in a separate PDF document in PACS. This dictation was created using the PDF document and an interactive 3D model of the results. 3D model is not available in the EMR/PACS. Normal FFR range is >0.80.   1. Left Main: No significant stenosis. LM FFR = 0.99.   2. LAD: No significant stenosis. Proximal FFR = 0.98, Mid FFR = 0.90, Distal FFR = 0.87.   3. LCX: FNo significant stenosis. Proximal FFR = 0.98, Mid FFR = 0.98, Distal FFR = 0.95.   4. RCA: No significant stenosis. Proximal FFR = 0.99, Mid FFR = 0.99, Distal FFR = 0.99.   IMPRESSION: 1.  Coronary CTA FFR analysis demonstrates no flow limiting lesions.    CT calcium score 05/17/2020:  FINDINGS: CORONARY CALCIUM SCORES:   Left Main: 0   LAD: 176-calcification within the proximal and mid LAD.   LCx: 1.6   RCA: 633-calcifications within the proximal, mid, and distal right circumflex.   Total Agatston Score: 816   MESA database percentile: 97th   AORTA MEASUREMENTS:   Ascending Aorta: 32 mm   Descending Aorta: 28 mm   OTHER FINDINGS:   Cardiovascular: Normal aortic caliber. Normal heart size, without pericardial effusion.   Mediastinum/Nodes: No imaged thoracic adenopathy.   Lungs/Pleura: No pleural fluid.  Clear imaged lungs.   Upper Abdomen: Normal imaged portions of the liver, spleen, stomach.   Musculoskeletal: No acute osseous abnormality.   IMPRESSION: 1. Total Agatston score of 816, corresponding to 97th percentile for age, sex, and race based cohort. 2. No acute process in the imaged chest.     Electronically Signed   By: Abigail Miyamoto M.D.  EKG:  EKG is personally  reviewed.  12/03/2021: Normal rhythm 76 bpm.  07/17/2020: The ekg ordered today demonstrates sinus rhythm 73 with no other abnormalities.  Recent Labs: No results found for requested labs within last 365 days.  Recent Lipid Panel    Component Value Date/Time   CHOL 176 05/09/2021 0759   TRIG 100 05/09/2021 0759   HDL 56 05/09/2021 0759   CHOLHDL 3.1 05/09/2021 0759   LDLCALC 102 (H) 05/09/2021 0759      Physical Exam:    VS:  BP 120/80 (BP Location: Left Arm, Patient Position: Sitting, Cuff Size: Normal)  Pulse 76   Ht 6' (1.829 m)   Wt 200 lb (90.7 kg)   SpO2 95%   BMI 27.12 kg/m     Wt Readings from Last 3 Encounters:  12/03/21 200 lb (90.7 kg)  07/17/20 200 lb (90.7 kg)  12/14/12 191 lb (86.6 kg)     GEN:  Well nourished, well developed in no acute distress HEENT: Normal NECK: No JVD; No carotid bruits LYMPHATICS: No lymphadenopathy CARDIAC: RRR, no murmurs, rubs, gallops RESPIRATORY:  Clear to auscultation without rales, wheezing or rhonchi  ABDOMEN: Soft, non-tender, non-distended MUSCULOSKELETAL:  No edema; No deformity  SKIN: Warm and dry NEUROLOGIC:  Alert and oriented x 3 PSYCHIATRIC:  Normal affect   ASSESSMENT:    1. Elevated coronary artery calcium score   2. Medication management     PLAN:    In order of problems listed above:  Coronary artery atherosclerosis/coronary artery calcification/hyperlipidemia - Previously personally reviewed his coronary calcium scan, there is some motion artifact of his RCA which will amplify the calcium score. -Increase therapy from Crestor 10 up to Crestor 20 and then eventually added Zetia 10 mg to help lower his LDL less than 70  No myalgias.  Dr.'s day labs showed LDL 61 on 06/11/21.  Excellent. -Continue with aspirin 81 mg a day.  Watch for any signs of bleeding. -He has 3 children.  Make sure that their cholesterol levels are checked and consider coronary calcium scores when they approach 60 years old  potentially.   Follow Up: 1 year follow up.  Medication Adjustments/Labs and Tests Ordered: Current medicines are reviewed at length with the patient today.  Concerns regarding medicines are outlined above.  Orders Placed This Encounter  Procedures   EKG 12-Lead   Meds ordered this encounter  Medications   rosuvastatin (CRESTOR) 20 MG tablet    Sig: TAKE 1 TABLET(20 MG) BY MOUTH DAILY    Dispense:  90 tablet    Refill:  3    Patient Instructions  Medication Instructions:  The current medical regimen is effective;  continue present plan and medications.  *If you need a refill on your cardiac medications before your next appointment, please call your pharmacy*  Follow-Up: At [facility], you and your health needs are our priority.  As part of our continuing mission to provide you with exceptional heart care, we have created designated Provider Care Teams.  These Care Teams include your primary Cardiologist (physician) and Advanced Practice Providers (APPs -  Physician Assistants and Nurse Practitioners) who all work together to provide you with the care you need, when you need it.  We recommend signing up for the patient portal called "MyChart".  Sign up information is provided on this After Visit Summary.  MyChart is used to connect with patients for Virtual Visits (Telemedicine).  Patients are able to view lab/test results, encounter notes, upcoming appointments, etc.  Non-urgent messages can be sent to your provider as well.   To learn more about what you can do with MyChart, go to [email or web address].    Your next appointment:   1 year(s)  The format for your next appointment:   In Person  Provider:   Donato Schultz, MD      Important Information About Sugar          Jodelle Gross Ford,acting as a scribe for Donato Schultz, MD.,have documented all relevant documentation on the behalf of Donato Schultz, MD,as directed by  Donato Schultz, MD while in the presence of  Donato Schultz, MD.   I, Donato Schultz, MD, have reviewed all documentation for this visit. The documentation on 12/03/21 for the exam, diagnosis, procedures, and orders are all accurate and complete.   Signed, Donato Schultz, MD  12/03/2021 2:02 PM    [facility]

## 2021-12-03 NOTE — Patient Instructions (Signed)
Medication Instructions:  The current medical regimen is effective;  continue present plan and medications.  *If you need a refill on your cardiac medications before your next appointment, please call your pharmacy*  Follow-Up: At [facility], you and your health needs are our priority.  As part of our continuing mission to provide you with exceptional heart care, we have created designated Provider Care Teams.  These Care Teams include your primary Cardiologist (physician) and Advanced Practice Providers (APPs -  Physician Assistants and Nurse Practitioners) who all work together to provide you with the care you need, when you need it.  We recommend signing up for the patient portal called "MyChart".  Sign up information is provided on this After Visit Summary.  MyChart is used to connect with patients for Virtual Visits (Telemedicine).  Patients are able to view lab/test results, encounter notes, upcoming appointments, etc.  Non-urgent messages can be sent to your provider as well.   To learn more about what you can do with MyChart, go to [email or web address].    Your next appointment:   1 year(s)  The format for your next appointment:   In Person  Provider:   Mark Skains, MD      Important Information About Sugar       

## 2022-02-07 DIAGNOSIS — E785 Hyperlipidemia, unspecified: Secondary | ICD-10-CM | POA: Diagnosis not present

## 2022-02-07 DIAGNOSIS — Z125 Encounter for screening for malignant neoplasm of prostate: Secondary | ICD-10-CM | POA: Diagnosis not present

## 2022-02-11 ENCOUNTER — Other Ambulatory Visit: Payer: Self-pay | Admitting: Cardiology

## 2022-02-18 DIAGNOSIS — Z1331 Encounter for screening for depression: Secondary | ICD-10-CM | POA: Diagnosis not present

## 2022-02-18 DIAGNOSIS — Z0000 Encounter for general adult medical examination without abnormal findings: Secondary | ICD-10-CM | POA: Diagnosis not present

## 2022-02-18 DIAGNOSIS — Z1389 Encounter for screening for other disorder: Secondary | ICD-10-CM | POA: Diagnosis not present

## 2022-02-18 DIAGNOSIS — E785 Hyperlipidemia, unspecified: Secondary | ICD-10-CM | POA: Diagnosis not present

## 2022-02-18 DIAGNOSIS — R82998 Other abnormal findings in urine: Secondary | ICD-10-CM | POA: Diagnosis not present

## 2023-01-14 ENCOUNTER — Other Ambulatory Visit: Payer: Self-pay

## 2023-01-14 ENCOUNTER — Telehealth: Payer: Self-pay | Admitting: Cardiology

## 2023-01-14 MED ORDER — ROSUVASTATIN CALCIUM 20 MG PO TABS: ORAL_TABLET | ORAL | 0 refills | Status: DC

## 2023-01-14 MED ORDER — ROSUVASTATIN CALCIUM 20 MG PO TABS: ORAL_TABLET | ORAL | 0 refills | Status: AC

## 2023-01-14 NOTE — Telephone Encounter (Signed)
*  STAT* If patient is at the pharmacy, call can be transferred to refill team.   1. Which medications need to be refilled? (please list name of each medication and dose if known) new prescription for  Rosuvastatin   2. Would you like to learn more about the convenience, safety, & potential cost savings by using the [facility]?      3. Are you open to using the [facility] (Type [facility].   4. Which pharmacy/location (including street and city if local pharmacy) is medication to be sent to? [facility]  [place], [place]   5. Do they need a 30 day or 90 day supply? 90 days and refills- please call today- out of medicine

## 2023-01-15 ENCOUNTER — Encounter: Payer: Self-pay | Admitting: Internal Medicine

## 2023-01-19 ENCOUNTER — Encounter: Payer: Self-pay | Admitting: Internal Medicine

## 2023-02-18 ENCOUNTER — Ambulatory Visit (AMBULATORY_SURGERY_CENTER): Payer: BC Managed Care – PPO

## 2023-02-18 VITALS — Ht 73.0 in | Wt 200.0 lb

## 2023-02-18 DIAGNOSIS — E785 Hyperlipidemia, unspecified: Secondary | ICD-10-CM | POA: Diagnosis not present

## 2023-02-18 DIAGNOSIS — Z125 Encounter for screening for malignant neoplasm of prostate: Secondary | ICD-10-CM | POA: Diagnosis not present

## 2023-02-18 DIAGNOSIS — Z1211 Encounter for screening for malignant neoplasm of colon: Secondary | ICD-10-CM

## 2023-02-18 DIAGNOSIS — R739 Hyperglycemia, unspecified: Secondary | ICD-10-CM | POA: Diagnosis not present

## 2023-02-18 MED ORDER — NA SULFATE-K SULFATE-MG SULF 17.5-3.13-1.6 GM/177ML PO SOLN: 1.0000 | Freq: Once | ORAL | 0 refills | Status: AC

## 2023-02-18 NOTE — Progress Notes (Signed)

## 2023-02-24 DIAGNOSIS — R82998 Other abnormal findings in urine: Secondary | ICD-10-CM | POA: Diagnosis not present

## 2023-02-25 ENCOUNTER — Other Ambulatory Visit: Payer: Self-pay | Admitting: Cardiology

## 2023-03-18 ENCOUNTER — Encounter: Payer: Self-pay | Admitting: Internal Medicine

## 2023-03-19 ENCOUNTER — Encounter: Payer: Self-pay | Admitting: Internal Medicine

## 2023-03-19 NOTE — Progress Notes (Signed)
 [facility] History and Physical   Primary Care Physician:  Onita Rush, MD   Reason for Procedure:  Colon cancer screening  Plan:    Colonoscopy     HPI: Eric Dixon is a 63 y.o. male with a history of a normal screening colonoscopy in 2014 who presents for repeat screening colonoscopy.   Past Medical History:  Diagnosis Date   Allergy    CAD (coronary artery disease)    Fracture of finger    Hyperlipidemia    Hypertension     Past Surgical History:  Procedure Laterality Date   COLONOSCOPY     INGUINAL HERNIA REPAIR     VASECTOMY      Prior to Admission medications   Medication Sig Start Date End Date Taking? Authorizing Provider  aspirin  EC 81 MG tablet Take 1 tablet (81 mg total) by mouth daily. Swallow whole. 07/17/20   Jeffrie Oneil BROCKS, MD  ezetimibe  (ZETIA ) 10 MG tablet TAKE 1 TABLET(10 MG) BY MOUTH DAILY 02/25/23   Jeffrie Oneil BROCKS, MD  IBUPROFEN PO Take by mouth as needed.    [provider]  MELATONIN PO Take by mouth as needed.    [provider]  rosuvastatin  (CRESTOR ) 20 MG tablet TAKE 1 TABLET(20 MG) BY MOUTH DAILY 01/14/23   Jeffrie Oneil BROCKS, MD    Current Outpatient Medications  Medication Sig Dispense Refill   aspirin  EC 81 MG tablet Take 1 tablet (81 mg total) by mouth daily. Swallow whole. 90 tablet 3   ezetimibe  (ZETIA ) 10 MG tablet TAKE 1 TABLET(10 MG) BY MOUTH DAILY 30 tablet 0   IBUPROFEN PO Take by mouth as needed.     MELATONIN PO Take by mouth as needed.     rosuvastatin  (CRESTOR ) 20 MG tablet TAKE 1 TABLET(20 MG) BY MOUTH DAILY 15 tablet 0   Current Facility-Administered Medications  Medication Dose Route Frequency Provider Last Rate Last Admin   0.9 %  sodium chloride  infusion  500 mL Intravenous Once Avram Lupita BRAVO, MD        Allergies as of 03/20/2023   (No Known Allergies)    Family History  Problem Relation Age of Onset   Other Father        POLIO   Parkinson's disease Father    Other Brother         BOATING ACCIDEN   Colon cancer Neg Hx    Rectal cancer Neg Hx    Stomach cancer Neg Hx    Esophageal cancer Neg Hx    Colon polyps Neg Hx     Social History   Socioeconomic History   Marital status: Married    Spouse name: Not on file   Number of children: Not on file   Years of education: Not on file   Highest education level: Not on file  Occupational History   Not on file  Tobacco Use   Smoking status: Never   Smokeless tobacco: Never  Vaping Use   Vaping status: Never Used  Substance and Sexual Activity   Alcohol  use: Yes    Comment: socially   Drug use: No   Sexual activity: Not on file  Other Topics Concern   Not on file  Social History Narrative   Not on file   Social Drivers of Health   Financial Resource Strain: Not on file  Food Insecurity: Not on file  Transportation Needs: Not on file  Physical Activity: Not on file  Stress: Not on file  Social Connections: Not on file  Intimate Partner Violence: Not on file    Review of Systems:  All other review of systems negative except as mentioned in the HPI.  Physical Exam: Vital signs BP (!) 156/85   Pulse 92   Temp 98.5 F (36.9 C) (Temporal)   Ht 6' (1.829 m)   Wt 200 lb (90.7 kg)   SpO2 95%   BMI 27.12 kg/m   General:   Alert,  Well-developed, well-nourished, pleasant and cooperative in NAD Lungs:  Clear throughout to auscultation.   Heart:  Regular rate and rhythm; no murmurs, clicks, rubs,  or gallops. Abdomen:  Soft, nontender and nondistended. Normal bowel sounds.   Neuro/Psych:  Alert and cooperative. Normal mood and affect. A and O x 3   @Amos Gaber  CHARLENA Commander, MD, [facility] [phone] (pager) 03/20/2023 7:48 AM@

## 2023-03-20 ENCOUNTER — Ambulatory Visit (AMBULATORY_SURGERY_CENTER): Payer: BC Managed Care – PPO | Admitting: Internal Medicine

## 2023-03-20 ENCOUNTER — Encounter: Payer: Self-pay | Admitting: Internal Medicine

## 2023-03-20 VITALS — BP 113/74 | HR 75 | Temp 98.5°F | Resp 8 | Ht 72.0 in | Wt 200.0 lb

## 2023-03-20 DIAGNOSIS — Z1211 Encounter for screening for malignant neoplasm of colon: Secondary | ICD-10-CM

## 2023-03-20 MED ORDER — SODIUM CHLORIDE 0.9 % IV SOLN: 500.0000 mL | Freq: Once | INTRAVENOUS | Status: DC

## 2023-03-20 NOTE — Progress Notes (Signed)
 [place], gd SR, tolerated procedure well, VSS, report to RN

## 2023-03-20 NOTE — Op Note (Signed)
 [facility] Patient Name: Eric Dixon Procedure Date: 03/20/2023 7:18 AM MRN: [id number] Endoscopist: Lupita FORBES Commander , MD, [id number] Age: 63 Referring MD:  Date of Birth: 1960/06/09 Gender: Male Account #: [id number] Procedure:                Colonoscopy Indications:              Screening for colorectal malignant neoplasm, Last                            colonoscopy: 2014 Medicines:                Monitored Anesthesia Care Procedure:                Pre-Anesthesia Assessment:                           - Prior to the procedure, a History and Physical                            was performed, and patient medications and                            allergies were reviewed. The patient's tolerance of                            previous anesthesia was also reviewed. The risks                            and benefits of the procedure and the sedation                            options and risks were discussed with the patient.                            All questions were answered, and informed consent                            was obtained. Prior Anticoagulants: The patient has                            taken no anticoagulant or antiplatelet agents. ASA                            Grade Assessment: II - A patient with mild systemic                            disease. After reviewing the risks and benefits,                            the patient was deemed in satisfactory condition to                            undergo the procedure.  After obtaining informed consent, the colonoscope                            was passed under direct vision. Throughout the                            procedure, the patient's blood pressure, pulse, and                            oxygen saturations were monitored continuously. The                            CF [id number] #[id number] was introduced through the anus                            and advanced to the the cecum,  identified by                            appendiceal orifice and ileocecal valve. The                            colonoscopy was performed without difficulty. The                            patient tolerated the procedure well. The quality                            of the bowel preparation was good. The ileocecal                            valve, appendiceal orifice, and rectum were                            photographed. The bowel preparation used was SUPREP                            via split dose instruction. Scope In: 8:01:29 AM Scope Out: 8:16:43 AM Scope Withdrawal Time: 0 hours 11 minutes 16 seconds  Total Procedure Duration: 0 hours 15 minutes 14 seconds  Findings:                 The perianal and digital rectal examinations were                            normal. Pertinent negatives include normal prostate                            (size, shape, and consistency).                           The entire examined colon appeared normal on direct                            and retroflexion views. Complications:  No immediate complications. Estimated Blood Loss:     Estimated blood loss: none. Impression:               - The entire examined colon is normal on direct and                            retroflexion views.                           - No specimens collected. Recommendation:           - Patient has a contact number available for                            emergencies. The signs and symptoms of potential                            delayed complications were discussed with the                            patient. Return to normal activities tomorrow.                            Written discharge instructions were provided to the                            patient.                           - Resume previous diet.                           - Continue present medications.                           - Repeat colonoscopy in 10 years for screening                             purposes. Lupita FORBES Commander, MD 03/20/2023 8:23:32 AM This report has been signed electronically.

## 2023-03-20 NOTE — Patient Instructions (Addendum)
 Normal again!  No polyps or cancer seen.  Next routine colonoscopy or other screening test in 10 years - 2035.  I appreciate the opportunity to care for you. Lupita CHARLENA Commander, MD, [facility]   Discharge instructions given. Normal exam. Resume previous medications. YOU HAD AN ENDOSCOPIC PROCEDURE TODAY AT THE [facility]:   Refer to the procedure report that was given to you for any specific questions about what was found during the examination.  If the procedure report does not answer your questions, please call your gastroenterologist to clarify.  If you requested that your care partner not be given the details of your procedure findings, then the procedure report has been included in a sealed envelope for you to review at your convenience later.  YOU SHOULD EXPECT: Some feelings of bloating in the abdomen. Passage of more gas than usual.  Walking can help get rid of the air that was put into your GI tract during the procedure and reduce the bloating. If you had a lower endoscopy (such as a colonoscopy or flexible sigmoidoscopy) you may notice spotting of blood in your stool or on the toilet paper. If you underwent a bowel prep for your procedure, you may not have a normal bowel movement for a few days.  Please Note:  You might notice some irritation and congestion in your nose or some drainage.  This is from the oxygen used during your procedure.  There is no need for concern and it should clear up in a day or so.  SYMPTOMS TO REPORT IMMEDIATELY:  Following lower endoscopy (colonoscopy or flexible sigmoidoscopy):  Excessive amounts of blood in the stool  Significant tenderness or worsening of abdominal pains  Swelling of the abdomen that is new, acute  Fever of 100F or higher   For urgent or emergent issues, a gastroenterologist can be reached at any hour by calling [phone]. Do not use MyChart messaging for urgent concerns.    DIET:  We do recommend a small meal at  first, but then you may proceed to your regular diet.  Drink plenty of fluids but you should avoid alcoholic beverages for 24 hours.  ACTIVITY:  You should plan to take it easy for the rest of today and you should NOT DRIVE or use heavy machinery until tomorrow (because of the sedation medicines used during the test).    FOLLOW UP: Our staff will call the number listed on your records the next business day following your procedure.  We will call around 7:15- 8:00 am to check on you and address any questions or concerns that you may have regarding the information given to you following your procedure. If we do not reach you, we will leave a message.     If any biopsies were taken you will be contacted by phone or by letter within the next 1-3 weeks.  Please call us  at [phone] if you have not heard about the biopsies in 3 weeks.    SIGNATURES/CONFIDENTIALITY: You and/or your care partner have signed paperwork which will be entered into your electronic medical record.  These signatures attest to the fact that that the information above on your After Visit Summary has been reviewed and is understood.  Full responsibility of the confidentiality of this discharge information lies with you and/or your care-partner.

## 2023-03-20 NOTE — Progress Notes (Signed)
 Pt's states no medical or surgical changes since previsit or office visit.

## 2023-03-23 ENCOUNTER — Telehealth: Payer: Self-pay

## 2023-03-23 NOTE — Telephone Encounter (Signed)
  Follow up Call-     03/20/2023    7:12 AM  Call back number  Post procedure Call Back phone  # [phone]  Permission to leave phone message Yes     Patient questions:  Do you have a fever, pain , or abdominal swelling? No. Pain Score  0 *  Have you tolerated food without any problems? Yes.    Have you been able to return to your normal activities? Yes.    Do you have any questions about your discharge instructions: Diet   No. Medications  No. Follow up visit  No.  Do you have questions or concerns about your Care? No.  Actions: * If pain score is 4 or above: No action needed, pain <4.

## 2023-07-01 DIAGNOSIS — H52203 Unspecified astigmatism, bilateral: Secondary | ICD-10-CM | POA: Diagnosis not present

## 2023-07-01 DIAGNOSIS — H2513 Age-related nuclear cataract, bilateral: Secondary | ICD-10-CM | POA: Diagnosis not present

## 2023-07-01 DIAGNOSIS — H353131 Nonexudative age-related macular degeneration, bilateral, early dry stage: Secondary | ICD-10-CM | POA: Diagnosis not present

## 2023-12-22 NOTE — H&P (View-Only) (Signed)
 Cardiology Office Note    Date:  12/23/2023  ID:  Eric Dixon, DOB [birth year], MRN [id number] PCP:  Onita Rush, MD  Cardiologist:  Oneil Parchment, MD  Electrophysiologist:  None   Chief Complaint: f/u coronary artery disease, in new Afib today  History of Present Illness: .    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 Remote MVP seen for follow-up. CAC by PCP in 05/2020 was 816 -> seen by Dr. Parchment with coronary CTA 08/2020 showing CAC 622 (95%ile), mild disease in RCA, ramus, and LAD, managed medically. Has been treated with rosuvastatin  and ezetimibe  to bring LDL <70. He recalls having a remote stress test as well as echocardiogram in the past that showed MVP. He does not explicitly recall being told he had a murmur in the past.  He presents for follow-up today and is noted to be in new onset atrial fibrillation with HR high 90's. He is not acutely aware of this but has noticed over the last year that his endurance has been down. He has possibly noticed an irregular heart rate at times, but no clear onset/offset of palpitations. He relays two incidents with URI/possible RSV in December then [place] trip in the summer with higher altitude where he noticed more dyspnea on exertion than usual with activity, with eventual improvement following the episodes. However, he has still since been able to ride his Peloton and remain active. No chest pain, edema, pre-syncope, syncope or bleeding history. Drinks 2 glasses of wine nightly when not working.  Labwork independently reviewed: KPN 02/2023 LDL 71, trig 77, K 4.7, ALT wnl, TSH wnl 2023 LDL 102, trig 100 2018 Hgb 14.8, Cr 1.0   ROS: .    Please see the history of present illness.  All other systems are reviewed and otherwise negative.  Studies Reviewed: SABRA    EKG:  EKG is ordered  today, personally reviewed, demonstrating   EKG Interpretation Date/Time:  Wednesday December 23 2023 09:26:17 EDT Ventricular Rate:  96 PR Interval:    QRS Duration:  90 QT Interval:  356 QTC Calculation: 449 R Axis:   39  Text Interpretation: Newly recognized atrial fibrillation with mildly elevated rate No acute ST/TW changes Confirmed by Leisa Gault [phone]) on 12/23/2023 10:06:46 AM    CV Studies: Cardiac studies reviewed are outlined and summarized above. Otherwise please see EMR for full report.   Current Reported Medications:.    Current Meds  Medication Sig   aspirin  EC 81 MG tablet Take 1 tablet (81 mg total) by mouth daily. Swallow whole.   ezetimibe  (ZETIA ) 10 MG tablet TAKE 1 TABLET(10 MG) BY MOUTH DAILY   IBUPROFEN PO Take by mouth as needed.   MELATONIN PO Take by mouth as needed.   rosuvastatin  (CRESTOR ) 20 MG tablet TAKE 1 TABLET(20 MG) BY MOUTH DAILY    Physical Exam:    VS:  BP 122/70   Pulse 96   Ht 6' 1 (1.854 m)   Wt 200 lb (90.7 kg)   BMI 26.39 kg/m    Wt Readings from Last 3 Encounters:  12/23/23 200 lb (90.7 kg)  03/20/23 200 lb (90.7 kg)  02/18/23 200 lb (90.7 kg)    GEN: Well nourished, well developed in no acute distress NECK: No JVD; Right carotid bruit CARDIAC: irregularly irregular, prominent systolic murmur heard best at apex (holosystolic), also heard  less prominently across precordium, no rubs or gallops RESPIRATORY:  Clear to auscultation without rales, wheezing or rhonchi  ABDOMEN: Soft, non-tender, non-distended EXTREMITIES:  No edema; No acute deformity   Asessement and Plan:.    1. New onset atrial fibrillation - duration unclear. HR high 90s. He has had possibly some irregularity of his heartbeat but no clear onset/offset of palpitations. He has noticed mild decreased in endurance over the last year with specific uptick of DOE during a URI/possible RSV in December then again while hiking in [place] in higher altitude in the  summer. This seemed to improve following each episode. He is still able to ride his Peloton regularly. No chest pain. I discussed case with DOD Dr. Barbaraann who also confirmed EKG represents coarse atrial fibrillation. Check labs today including CBC, CMET, TSH, Mg, and A1C. CHADSVASC is 1 for now for CAD, but pending eval for DM/LVEF. Obtain echocardiogram to evaluate LV function and also prominent systolic murmur - exam suggests potentially significant mitral regurgitation. We order a 7 day Zio to assess HR excursion and evaluate for whether AF is persistent or paroxysmal. We will initiate Toprol  25mg  daily and Eliquis 5mg  BID in anticipation of consideration of cardioversion in the near future. However, the plan may depend on what echocardiogram shows. We also discussed potential relationship of alcohol  to tendency for atrial fibrillation, reviewed that reduction can help reduce incidence going forward. Addendum: echo does not appear to be scheduled until later in November. Per my discussion with Dr. Barbaraann, have spoken with staff to see if we can please expedite this as this may impact the plan for rhythm control and further evaluation.  2. Systolic murmur - suspicious for mitral regurgitation at apex but also heard through precordium. 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 No prior echo available for review. Will obtain.   3. Right carotid bruit - suspect radiation from murmur but will obtain carotid duplex given history of vascular disease.  4. Coronary artery disease - no recent chest pain. Await echo as above. Will reach out to Dr. Jeffrie regarding continuation of ASA since starting Eliquis.  5. Dyslipidemia - per KPN being monitored by primary care last assessed in December. Can re-evaluate plan for recheck in follow-up.    Disposition: F/u with me, APP or Dr. Jeffrie in 3 weeks for review of echo and discussion of rhythm plan.  Signed, Jedaiah Rathbun N Rion Schnitzer, PA-C

## 2023-12-22 NOTE — Progress Notes (Unsigned)
 Cardiology Office Note    Date:  12/23/2023  ID:  Eric Dixon, DOB [birth year], MRN [id number] PCP:  Onita Rush, MD  Cardiologist:  Oneil Parchment, MD  Electrophysiologist:  None   Chief Complaint: f/u coronary artery disease, in new Afib today  History of Present Illness: .    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 Remote MVP seen for follow-up. CAC by PCP in 05/2020 was 816 -> seen by Dr. Parchment with coronary CTA 08/2020 showing CAC 622 (95%ile), mild disease in RCA, ramus, and LAD, managed medically. Has been treated with rosuvastatin  and ezetimibe  to bring LDL <70. He recalls having a remote stress test as well as echocardiogram in the past that showed MVP. He does not explicitly recall being told he had a murmur in the past.  He presents for follow-up today and is noted to be in new onset atrial fibrillation with HR high 90's. He is not acutely aware of this but has noticed over the last year that his endurance has been down. He has possibly noticed an irregular heart rate at times, but no clear onset/offset of palpitations. He relays two incidents with URI/possible RSV in December then [place] trip in the summer with higher altitude where he noticed more dyspnea on exertion than usual with activity, with eventual improvement following the episodes. However, he has still since been able to ride his Peloton and remain active. No chest pain, edema, pre-syncope, syncope or bleeding history. Drinks 2 glasses of wine nightly when not working.  Labwork independently reviewed: KPN 02/2023 LDL 71, trig 77, K 4.7, ALT wnl, TSH wnl 2023 LDL 102, trig 100 2018 Hgb 14.8, Cr 1.0   ROS: .    Please see the history of present illness.  All other systems are reviewed and otherwise negative.  Studies Reviewed: SABRA    EKG:  EKG is ordered  today, personally reviewed, demonstrating   EKG Interpretation Date/Time:  Wednesday December 23 2023 09:26:17 EDT Ventricular Rate:  96 PR Interval:    QRS Duration:  90 QT Interval:  356 QTC Calculation: 449 R Axis:   39  Text Interpretation: Newly recognized atrial fibrillation with mildly elevated rate No acute ST/TW changes Confirmed by Leisa Gault [phone]) on 12/23/2023 10:06:46 AM    CV Studies: Cardiac studies reviewed are outlined and summarized above. Otherwise please see EMR for full report.   Current Reported Medications:.    Current Meds  Medication Sig   aspirin  EC 81 MG tablet Take 1 tablet (81 mg total) by mouth daily. Swallow whole.   ezetimibe  (ZETIA ) 10 MG tablet TAKE 1 TABLET(10 MG) BY MOUTH DAILY   IBUPROFEN PO Take by mouth as needed.   MELATONIN PO Take by mouth as needed.   rosuvastatin  (CRESTOR ) 20 MG tablet TAKE 1 TABLET(20 MG) BY MOUTH DAILY    Physical Exam:    VS:  BP 122/70   Pulse 96   Ht 6' 1 (1.854 m)   Wt 200 lb (90.7 kg)   BMI 26.39 kg/m    Wt Readings from Last 3 Encounters:  12/23/23 200 lb (90.7 kg)  03/20/23 200 lb (90.7 kg)  02/18/23 200 lb (90.7 kg)    GEN: Well nourished, well developed in no acute distress NECK: No JVD; Right carotid bruit CARDIAC: irregularly irregular, prominent systolic murmur heard best at apex (holosystolic), also heard  less prominently across precordium, no rubs or gallops RESPIRATORY:  Clear to auscultation without rales, wheezing or rhonchi  ABDOMEN: Soft, non-tender, non-distended EXTREMITIES:  No edema; No acute deformity   Asessement and Plan:.    1. New onset atrial fibrillation - duration unclear. HR high 90s. He has had possibly some irregularity of his heartbeat but no clear onset/offset of palpitations. He has noticed mild decreased in endurance over the last year with specific uptick of DOE during a URI/possible RSV in December then again while hiking in [place] in higher altitude in the  summer. This seemed to improve following each episode. He is still able to ride his Peloton regularly. No chest pain. I discussed case with DOD Dr. Barbaraann who also confirmed EKG represents coarse atrial fibrillation. Check labs today including CBC, CMET, TSH, Mg, and A1C. CHADSVASC is 1 for now for CAD, but pending eval for DM/LVEF. Obtain echocardiogram to evaluate LV function and also prominent systolic murmur - exam suggests potentially significant mitral regurgitation. We order a 7 day Zio to assess HR excursion and evaluate for whether AF is persistent or paroxysmal. We will initiate Toprol  25mg  daily and Eliquis 5mg  BID in anticipation of consideration of cardioversion in the near future. However, the plan may depend on what echocardiogram shows. We also discussed potential relationship of alcohol  to tendency for atrial fibrillation, reviewed that reduction can help reduce incidence going forward. Addendum: echo does not appear to be scheduled until later in November. Per my discussion with Dr. Barbaraann, have spoken with staff to see if we can please expedite this as this may impact the plan for rhythm control and further evaluation.  2. Systolic murmur - suspicious for mitral regurgitation at apex but also heard through precordium. 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 No prior echo available for review. Will obtain.   3. Right carotid bruit - suspect radiation from murmur but will obtain carotid duplex given history of vascular disease.  4. Coronary artery disease - no recent chest pain. Await echo as above. Will reach out to Dr. Jeffrie regarding continuation of ASA since starting Eliquis.  5. Dyslipidemia - per KPN being monitored by primary care last assessed in December. Can re-evaluate plan for recheck in follow-up.    Disposition: F/u with me, APP or Dr. Jeffrie in 3 weeks for review of echo and discussion of rhythm plan.  Signed, Jedaiah Rathbun N Rion Schnitzer, PA-C

## 2023-12-23 ENCOUNTER — Ambulatory Visit: Attending: Cardiovascular Disease | Admitting: Physician Assistant

## 2023-12-23 ENCOUNTER — Encounter: Payer: Self-pay | Admitting: Physician Assistant

## 2023-12-23 ENCOUNTER — Ambulatory Visit: Attending: Physician Assistant

## 2023-12-23 ENCOUNTER — Other Ambulatory Visit (HOSPITAL_COMMUNITY): Payer: Self-pay

## 2023-12-23 VITALS — BP 122/70 | HR 96 | Ht 73.0 in | Wt 200.0 lb

## 2023-12-23 DIAGNOSIS — R011 Cardiac murmur, unspecified: Secondary | ICD-10-CM

## 2023-12-23 DIAGNOSIS — I2510 Atherosclerotic heart disease of native coronary artery without angina pectoris: Secondary | ICD-10-CM | POA: Diagnosis not present

## 2023-12-23 DIAGNOSIS — R0989 Other specified symptoms and signs involving the circulatory and respiratory systems: Secondary | ICD-10-CM | POA: Diagnosis not present

## 2023-12-23 DIAGNOSIS — I4891 Unspecified atrial fibrillation: Secondary | ICD-10-CM

## 2023-12-23 DIAGNOSIS — E785 Hyperlipidemia, unspecified: Secondary | ICD-10-CM

## 2023-12-23 MED ORDER — APIXABAN 5 MG PO TABS
5.0000 mg | ORAL_TABLET | Freq: Two times a day (BID) | ORAL | 5 refills | Status: AC
  Filled 2023-12-23: qty 60, 30d supply, fill #0
  Filled 2024-01-17: qty 60, 30d supply, fill #1
  Filled 2024-02-26: qty 60, 30d supply, fill #2
  Filled 2024-03-28: qty 60, 30d supply, fill #3

## 2023-12-23 MED ORDER — METOPROLOL SUCCINATE ER 25 MG PO TB24: 25.0000 mg | ORAL_TABLET | Freq: Every day | ORAL | 2 refills | Status: DC

## 2023-12-23 NOTE — Progress Notes (Unsigned)
 Enrolled for [employer] to mail a ZIO XT long term holter monitor to the patients address on file.   Dr. Jeffrie to read.

## 2023-12-23 NOTE — Patient Instructions (Signed)
 Medication Instructions:   START TAKING METOPROLOL  SUCCINATE (TOPROL  XL) 25 MG BY MOUTH DAILY--THIS WAS SENT TO YOUR REGULAR PHARMACY  START TAKING ELIQUIS 5 MG BY MOUTH TWICE DAILY--THIS WAS SENT DOWN TO OUR PHARMACY ON THE FIRST FLOOR TO GIVE YOU SAMPLES/COPAY SINCE THIS IS NEW START  *If you need a refill on your cardiac medications before your next appointment, please call your pharmacy*  Lab Work:  TODAY--CMET, CBC, TSH, HEMOGLOBIN A1C, AND MAGNESIUM LEVEL  If you have labs (blood work) drawn today and your tests are completely normal, you will receive your results only by: MyChart Message (if you have MyChart) OR A paper copy in the mail If you have any lab test that is abnormal or we need to change your treatment, we will call you to review the results.   Testing/Procedures:  Your physician has requested that you have an echocardiogram. Echocardiography is a painless test that uses sound waves to create images of your heart. It provides your doctor with information about the size and shape of your heart and how well your heart's chambers and valves are working. This procedure takes approximately one hour. There are no restrictions for this procedure. Please do NOT wear cologne, perfume, aftershave, or lotions (deodorant is allowed). Please arrive 15 minutes prior to your appointment time.  Please note: We ask at that you not bring children with you during ultrasound (echo/ vascular) testing. Due to room size and safety concerns, children are not allowed in the ultrasound rooms during exams. Our front office staff cannot provide observation of children in our lobby area while testing is being conducted. An adult accompanying a patient to their appointment will only be allowed in the ultrasound room at the discretion of the ultrasound technician under special circumstances. We apologize for any inconvenience.   Your physician has requested that you have a carotid duplex. This test is  an ultrasound of the carotid arteries in your neck. It looks at blood flow through these arteries that supply the brain with blood. Allow one hour for this exam. There are no restrictions or special instructions.   ZIO XT- Long Term Monitor Instructions  Your physician has requested you wear a ZIO patch monitor for 7 days.  This is a single patch monitor. [employer] supplies one patch monitor per enrollment. Additional stickers are not available. Please do not apply patch if you will be having a Nuclear Stress Test,  Echocardiogram, Cardiac CT, MRI, or Chest Xray during the period you would be wearing the  monitor. The patch cannot be worn during these tests. You cannot remove and re-apply the  ZIO XT patch monitor.  Your ZIO patch monitor will be mailed 3 day USPS to your address on file. It may take 3-5 days  to receive your monitor after you have been enrolled.  Once you have received your monitor, please review the enclosed instructions. Your monitor  has already been registered assigning a specific monitor serial # to you.  Billing and Patient Assistance Program Information  We have supplied [employer] with any of your insurance information on file for billing purposes. [employer] offers a sliding scale Patient Assistance Program for patients that do not have  insurance, or whose insurance does not completely cover the cost of the ZIO monitor.  You must apply for the Patient Assistance Program to qualify for this discounted rate.  To apply, please call [employer] at [phone], select option 4, select option 2, ask to apply for  Patient Assistance Program.  [employer] will ask your household income, and how many people  are in your household. They will quote your out-of-pocket cost based on that information.  [employer] will also be able to set up a 54-month, interest-free payment plan if needed.  Applying the monitor   Shave hair from upper left chest.  Hold abrader disc by orange tab. Rub abrader  in 40 strokes over the upper left chest as  indicated in your monitor instructions.  Clean area with 4 enclosed alcohol  pads. Let dry.  Apply patch as indicated in monitor instructions. Patch will be placed under collarbone on left  side of chest with arrow pointing upward.  Rub patch adhesive wings for 2 minutes. Remove white label marked 1. Remove the white  label marked 2. Rub patch adhesive wings for 2 additional minutes.  While looking in a mirror, press and release button in center of patch. A small green light will  flash 3-4 times. This will be your only indicator that the monitor has been turned on.  Do not shower for the first 24 hours. You may shower after the first 24 hours.  Press the button if you feel a symptom. You will hear a small click. Record Date, Time and  Symptom in the Patient Logbook.  When you are ready to remove the patch, follow instructions on the last 2 pages of Patient  Logbook. Stick patch monitor onto the last page of Patient Logbook.  Place Patient Logbook in the blue and white box. Use locking tab on box and tape box closed  securely. The blue and white box has prepaid postage on it. Please place it in the mailbox as  soon as possible. Your physician should have your test results approximately 7 days after the  monitor has been mailed back to [facility].  Call [facility] Customer Care at [phone] if you have questions regarding  your ZIO XT patch monitor. Call them immediately if you see an orange light blinking on your  monitor.  If your monitor falls off in less than 4 days, contact our Monitor department at [phone].  If your monitor becomes loose or falls off after 4 days call [employer] at [phone] for  suggestions on securing your monitor   Follow-Up:  3 WEEKS WITH DAYNA DUNN PA-C, DR SKAINS, OR ANY EXTENDER  Other Instructions  It is important not to miss a single dose of Eliquis as it may impact our plan to pursue  cardioversion in a few weeks  Dayna Dunn PA-C will discuss continuation of ASA since initiating Eliquis with Dr. Jeffrie as well!

## 2023-12-24 ENCOUNTER — Ambulatory Visit: Payer: Self-pay | Admitting: Physician Assistant

## 2023-12-24 LAB — HEMOGLOBIN A1C
Est. average glucose Bld gHb Est-mCnc: 123 mg/dL
Hgb A1c MFr Bld: 5.9 % — ABNORMAL HIGH (ref 4.8–5.6)

## 2023-12-24 LAB — CBC
Hematocrit: 45.3 % (ref 37.5–51.0)
Hemoglobin: 14.1 g/dL (ref 13.0–17.7)
MCH: 28.7 pg (ref 26.6–33.0)
MCHC: 31.1 g/dL — ABNORMAL LOW (ref 31.5–35.7)
MCV: 92 fL (ref 79–97)
Platelets: 195 x10E3/uL (ref 150–450)
RBC: 4.91 x10E6/uL (ref 4.14–5.80)
RDW: 12.8 % (ref 11.6–15.4)
WBC: 5.5 x10E3/uL (ref 3.4–10.8)

## 2023-12-24 LAB — COMPREHENSIVE METABOLIC PANEL WITH GFR
ALT: 72 IU/L — ABNORMAL HIGH (ref 0–44)
AST: 45 IU/L — ABNORMAL HIGH (ref 0–40)
Albumin: 4.7 g/dL (ref 3.9–4.9)
Alkaline Phosphatase: 56 IU/L (ref 47–123)
BUN/Creatinine Ratio: 15 (ref 10–24)
BUN: 16 mg/dL (ref 8–27)
Bilirubin Total: 0.9 mg/dL (ref 0.0–1.2)
CO2: 24 mmol/L (ref 20–29)
Calcium: 9.5 mg/dL (ref 8.6–10.2)
Chloride: 101 mmol/L (ref 96–106)
Creatinine, Ser: 1.04 mg/dL (ref 0.76–1.27)
Globulin, Total: 2.2 g/dL (ref 1.5–4.5)
Glucose: 78 mg/dL (ref 70–99)
Potassium: 5.3 mmol/L — ABNORMAL HIGH (ref 3.5–5.2)
Sodium: 141 mmol/L (ref 134–144)
Total Protein: 6.9 g/dL (ref 6.0–8.5)
eGFR: 82 mL/min/1.73 (ref >59)

## 2023-12-24 LAB — MAGNESIUM: Magnesium: 2.3 mg/dL (ref 1.6–2.3)

## 2023-12-24 LAB — TSH: TSH: 0.872 u[IU]/mL (ref 0.450–4.500)

## 2023-12-24 NOTE — Telephone Encounter (Signed)
 S/w the patient- given information below. He said he does not feel like he needs to have his potassium checked again, it was only mildly elevated. He does not wish to repeat them- he said his kidney function is good and does not feel it warrants a re-check- he did have a banana yesterday.   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 Please let Dr. Gertrude know that labs are okay except a few outliers.    Potassium level is borderline elevated and needs recheck - may be lab issue since he is not on any meds to cause high potassium but needs to return for recheck potassium to ensure normal. Liver function now mildly abnormal; looks like PCP has been following labs so we do not have a good comparison so should touch base with primary care about trending. It's not yet in a range we need to stop cholesterol meds but likely need to follow moving forward. A1c is consistent with pre-diabetes range as well.    Porter talked with him yesterday about moving up echo, offered spot yesterday. She reported back that he preferred to leave for November due to work schedule. Can let him know I talked further with Dr. Barbaraann yesterday (since Dr Jeffrie is out) who recommends we move forward with that in a timely fashion because if he does have mitral regurgitation that warrants intervention, we want to get the ball rolling on his work up as this will likely impact how we treat his atrial fib as well. Dr. Barbaraann felt that with prior history of MVP, there may be a possibility he could have a valve leaflet issue and it would be beneficial to expedite his work up before he begins developing more symptoms. Thank you!

## 2023-12-25 NOTE — Telephone Encounter (Addendum)
 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 Looking through spots with Trish, I could not locate any sooner clinic echo appointments. Per her suggestion, reached out to [facility] who was able to locate an outpatient slot in the hospital on 10/21 at 10am. I called Dr. Gertrude and he agreed with this plan. Melissa will help adjust schedule. Will send final instructions of where to check in by MyChart. He will plan to place his monitor after his echocardiogram is completed. Melissa offered to also do carotid on 10/21; patient would prefer to leave carotid scheduled in Nov as he will be coming on time constraint to get the echo on 10/21 which is the main priority - agree. We also discussed that I had gotten green light to stop aspirin  per discussion with Dr. Barbaraann since Dr. Jeffrie is out. Removed from med list. Very grateful to echo team for helping with this.

## 2023-12-29 ENCOUNTER — Ambulatory Visit (HOSPITAL_COMMUNITY)
Admission: RE | Admit: 2023-12-29 | Discharge: 2023-12-29 | Disposition: A | Discharge status: Home / Self Care | Source: Ambulatory Visit | Attending: Physician Assistant | Admitting: Physician Assistant

## 2023-12-29 DIAGNOSIS — I2510 Atherosclerotic heart disease of native coronary artery without angina pectoris: Secondary | ICD-10-CM | POA: Insufficient documentation

## 2023-12-29 DIAGNOSIS — I10 Essential (primary) hypertension: Secondary | ICD-10-CM | POA: Diagnosis not present

## 2023-12-29 DIAGNOSIS — I340 Nonrheumatic mitral (valve) insufficiency: Secondary | ICD-10-CM | POA: Insufficient documentation

## 2023-12-29 DIAGNOSIS — I4891 Unspecified atrial fibrillation: Secondary | ICD-10-CM | POA: Diagnosis not present

## 2023-12-29 DIAGNOSIS — I358 Other nonrheumatic aortic valve disorders: Secondary | ICD-10-CM | POA: Insufficient documentation

## 2023-12-29 DIAGNOSIS — E785 Hyperlipidemia, unspecified: Secondary | ICD-10-CM | POA: Insufficient documentation

## 2023-12-29 DIAGNOSIS — R011 Cardiac murmur, unspecified: Secondary | ICD-10-CM | POA: Insufficient documentation

## 2023-12-29 LAB — ECHOCARDIOGRAM COMPLETE: S' Lateral: 3.48 cm

## 2023-12-30 ENCOUNTER — Telehealth: Payer: Self-pay | Admitting: Physician Assistant

## 2023-12-30 ENCOUNTER — Other Ambulatory Visit: Payer: Self-pay | Admitting: Physician Assistant

## 2023-12-30 ENCOUNTER — Encounter: Payer: Self-pay | Admitting: Physician Assistant

## 2023-12-30 DIAGNOSIS — I4891 Unspecified atrial fibrillation: Secondary | ICD-10-CM

## 2023-12-30 DIAGNOSIS — E875 Hyperkalemia: Secondary | ICD-10-CM

## 2023-12-30 DIAGNOSIS — I340 Nonrheumatic mitral (valve) insufficiency: Secondary | ICD-10-CM

## 2023-12-30 NOTE — Telephone Encounter (Addendum)
   I called Dr. Gertrude to discuss results. 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 Discussed with Dr. Barbaraann (whom I had discussed patient with in clinic last week when Dr. Jeffrie was out); he recommends proceeding with TEE/DCCV and referral to [facility] to consider repair/MAZE/LAA clipping. He also felt patient may be appropriate for coronary CTA in follow-up to evaluate coronary status pre-operatively. Patient requested referral to Dr. Lucas. I confirmed with CVTS team that he does sternotomy mitral valve repairs but if the patient wanted a mini thoracotomy, would need referral to [facility] (Dr. Ricky). Also updated patient of this information. I told him I will plan to discuss formal ischemic evaluation and surgery referral recommendations with Dr. Jeffrie when he returns to the office on Monday.   Discussed TEE/DCCV with patient. Have tentatively scheduled this for Wednesday 01/06/24 with Dr. Jeffrie, arrival time 9:30am. Eric Dixon assisted with setting this up and she sent it to pre-cert. Instructions sent to MyChart Letter. Discussed importance of no missed doses of Eliquis. He will return on Friday 01/01/24 for labs to ensure normalization of potassium, will also trend LFTs to ensure stable.  Informed Consent   Shared Decision Making/Informed Consent   The risks [stroke, cardiac arrhythmias rarely resulting in the need for a temporary or permanent pacemaker, skin irritation or burns, esophageal damage, perforation (1:10,000 risk), bleeding, pharyngeal hematoma as well as other potential complications associated with conscious sedation including aspiration, arrhythmia, respiratory failure and death], benefits (treatment guidance, restoration of normal sinus rhythm, diagnostic support) and alternatives of a transesophageal echocardiogram guided  cardioversion were discussed in detail with Eric Dixon and he is willing to proceed.    Given that he now has confirmed severe MR, we will hold off on the 7 day Zio as it's more likely that this is persistent rather than paroxysmal atrial fib. Dr. Gertrude also confirms HR has remained irregular when checking it, though HR normal. As he may still require the Zio for surveillance after cardioversion, will hold off on mailing back until we see how he does post-cardioversion. Would revisit in follow-up. I confirmed with monitor team he would not be charged for this if he ends up mailing it back unused. Also keep carotid US  as planned.  I will forward this message to Dr. Jeffrie to please call me on Monday when he returns to the office to confirm next steps.  Eric Dixon Eric Rever Pichette, PA-C

## 2023-12-30 NOTE — Progress Notes (Signed)
 Orders for TEE/DCCV

## 2023-12-30 NOTE — Telephone Encounter (Signed)
 Addressed via result note to patient, from D. Dunn PA She has plans to call him this afternoon, per notes

## 2024-01-01 DIAGNOSIS — E875 Hyperkalemia: Secondary | ICD-10-CM | POA: Diagnosis not present

## 2024-01-02 ENCOUNTER — Ambulatory Visit: Payer: Self-pay | Admitting: Physician Assistant

## 2024-01-02 LAB — COMPREHENSIVE METABOLIC PANEL WITH GFR
ALT: 52 IU/L — ABNORMAL HIGH (ref 0–44)
AST: 37 IU/L (ref 0–40)
Albumin: 4.7 g/dL (ref 3.9–4.9)
Alkaline Phosphatase: 50 IU/L (ref 47–123)
BUN/Creatinine Ratio: 13 (ref 10–24)
BUN: 16 mg/dL (ref 8–27)
Bilirubin Total: 1.2 mg/dL (ref 0.0–1.2)
CO2: 25 mmol/L (ref 20–29)
Calcium: 9.9 mg/dL (ref 8.6–10.2)
Chloride: 99 mmol/L (ref 96–106)
Creatinine, Ser: 1.23 mg/dL (ref 0.76–1.27)
Globulin, Total: 2.4 g/dL (ref 1.5–4.5)
Glucose: 112 mg/dL — ABNORMAL HIGH (ref 70–99)
Potassium: 4.9 mmol/L (ref 3.5–5.2)
Sodium: 139 mmol/L (ref 134–144)
Total Protein: 7.1 g/dL (ref 6.0–8.5)
eGFR: 67 mL/min/1.73 (ref >59)

## 2024-01-05 NOTE — Progress Notes (Signed)
 Called patient with pre-procedure instructions for tomorrow.   Patient informed of:   Time to arrive for procedure (0900). Remain NPO past midnight.  Must have a ride home and a responsible adult to remain with them for 24 hours post procedure. Instructed to take am meds with sip of water and confirmed blood thinner consistency.  Patient reports understanding and no other questions at this time.

## 2024-01-06 ENCOUNTER — Ambulatory Visit (HOSPITAL_COMMUNITY): Admitting: Anesthesiology

## 2024-01-06 ENCOUNTER — Other Ambulatory Visit: Payer: Self-pay

## 2024-01-06 ENCOUNTER — Telehealth: Payer: Self-pay | Admitting: Physician Assistant

## 2024-01-06 ENCOUNTER — Ambulatory Visit (HOSPITAL_COMMUNITY)
Admission: RE | Admit: 2024-01-06 | Discharge: 2024-01-06 | Disposition: A | Discharge status: Home / Self Care | Attending: Cardiology | Admitting: Cardiology

## 2024-01-06 ENCOUNTER — Ambulatory Visit (HOSPITAL_COMMUNITY)

## 2024-01-06 ENCOUNTER — Other Ambulatory Visit (HOSPITAL_COMMUNITY): Payer: Self-pay

## 2024-01-06 ENCOUNTER — Encounter (HOSPITAL_COMMUNITY)
Admission: RE | Disposition: A | Discharge status: Home / Self Care | Payer: Self-pay | Source: Home / Self Care | Attending: Cardiology

## 2024-01-06 ENCOUNTER — Encounter (HOSPITAL_COMMUNITY): Payer: Self-pay | Admitting: Cardiology

## 2024-01-06 DIAGNOSIS — I10 Essential (primary) hypertension: Secondary | ICD-10-CM | POA: Insufficient documentation

## 2024-01-06 DIAGNOSIS — I2510 Atherosclerotic heart disease of native coronary artery without angina pectoris: Secondary | ICD-10-CM | POA: Insufficient documentation

## 2024-01-06 DIAGNOSIS — I4891 Unspecified atrial fibrillation: Secondary | ICD-10-CM | POA: Diagnosis not present

## 2024-01-06 DIAGNOSIS — E785 Hyperlipidemia, unspecified: Secondary | ICD-10-CM | POA: Diagnosis not present

## 2024-01-06 DIAGNOSIS — Z79899 Other long term (current) drug therapy: Secondary | ICD-10-CM | POA: Insufficient documentation

## 2024-01-06 DIAGNOSIS — I340 Nonrheumatic mitral (valve) insufficiency: Secondary | ICD-10-CM

## 2024-01-06 DIAGNOSIS — Z7901 Long term (current) use of anticoagulants: Secondary | ICD-10-CM | POA: Insufficient documentation

## 2024-01-06 HISTORY — PX: TRANSESOPHAGEAL ECHOCARDIOGRAM (CATH LAB): EP1270

## 2024-01-06 HISTORY — PX: CARDIOVERSION: EP1203

## 2024-01-06 LAB — ECHO TEE

## 2024-01-06 SURGERY — TRANSESOPHAGEAL ECHOCARDIOGRAM (TEE) (CATHLAB)
Anesthesia: Monitor Anesthesia Care

## 2024-01-06 MED ORDER — SODIUM CHLORIDE 0.9 % IV SOLN: INTRAVENOUS | Status: DC

## 2024-01-06 MED ORDER — PROPOFOL 10 MG/ML IV BOLUS
INTRAVENOUS | Status: DC | PRN
  Administered 2024-01-06: 150 mg via INTRAVENOUS
  Administered 2024-01-06: 150 ug/kg/min via INTRAVENOUS

## 2024-01-06 MED ORDER — AMIODARONE HCL 200 MG PO TABS
ORAL_TABLET | ORAL | 0 refills | Status: DC
  Filled 2024-01-06: qty 52, 30d supply, fill #0

## 2024-01-06 MED ORDER — PHENYLEPHRINE 80 MCG/ML (10ML) SYRINGE FOR IV PUSH (FOR BLOOD PRESSURE SUPPORT)
PREFILLED_SYRINGE | INTRAVENOUS | Status: DC | PRN
  Administered 2024-01-06 (×3): 160 ug via INTRAVENOUS

## 2024-01-06 MED ORDER — LIDOCAINE HCL (CARDIAC) PF 100 MG/5ML IV SOSY
PREFILLED_SYRINGE | INTRAVENOUS | Status: DC | PRN
  Administered 2024-01-06: 100 mg via INTRAVENOUS

## 2024-01-06 SURGICAL SUPPLY — 1 items: PAD DEFIB RADIO PHYSIO CONN (PAD) ×1 IMPLANT

## 2024-01-06 NOTE — Anesthesia Postprocedure Evaluation (Signed)
 Anesthesia Post Note  Patient: Eric Dixon  Procedure(s) Performed: TRANSESOPHAGEAL ECHOCARDIOGRAM CARDIOVERSION     Patient location during evaluation: PACU Anesthesia Type: MAC Level of consciousness: awake and alert Pain management: pain level controlled Vital Signs Assessment: post-procedure vital signs reviewed and stable Respiratory status: spontaneous breathing, nonlabored ventilation, respiratory function stable and patient connected to nasal cannula oxygen Cardiovascular status: blood pressure returned to baseline and stable Postop Assessment: no apparent nausea or vomiting Anesthetic complications: no   No notable events documented.  Last Vitals:  Vitals:   01/06/24 1130 01/06/24 1135  BP: 112/80 117/82  Pulse: 67 68  Resp: (!) 24 18  Temp:    SpO2: 97% 97%    Last Pain:  Vitals:   01/06/24 1105  TempSrc: Oral  PainSc: 0-No pain                 Lynwood MARLA Cornea

## 2024-01-06 NOTE — Telephone Encounter (Addendum)
 Hi Debra, I am hoping you can help us  get a coronary CTA for a patient of Dr. Jeffrie since Holley is listed as out with you covering. 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 HR was 58 post cardioversion and Dr. Jeffrie is also adding amiodarone so should not need any extra beta blocker aside from his present regimen day of the procedure. Can you help arrange and contact patient with CT instructions? He is off next week so he is hoping to see if we can get this done next week if possible.  Structural team is helping behind the scenes with referral to [facility]/Dr. Ricky. Rockie Redman acknowledged our request. He will keep follow-up with Katlyn West as scheduled for post-cardioversion follow-up (I did not have anything that day). Thanks!

## 2024-01-06 NOTE — Anesthesia Preprocedure Evaluation (Addendum)
 Anesthesia Evaluation  Patient identified by MRN, date of birth, ID band Patient awake    Reviewed: Allergy & Precautions, NPO status , Patient's Chart, lab work & pertinent test results, reviewed documented beta blocker date and time   History of Anesthesia Complications Negative for: history of anesthetic complications  Airway Mallampati: II  TM Distance: >3 FB     Dental no notable dental hx.    Pulmonary neg COPD   breath sounds clear to auscultation       Cardiovascular hypertension, + CAD  + Valvular Problems/Murmurs MR  Rhythm:Regular Rate:Normal + Systolic murmurs IMPRESSIONS     1. Left ventricular ejection fraction, by estimation, is 55 to 60%. The  left ventricle has normal function. The left ventricle has no regional  wall motion abnormalities. Left ventricular diastolic parameters are  indeterminate.   2. Right ventricular systolic function is normal. The right ventricular  size is normal. There is normal pulmonary artery systolic pressure.   3. Left atrial size was moderately dilated.   4. Right atrial size was moderately dilated.   5. Severe prolapse of posterior leaflet with severe eccentric MR. The  mitral valve is abnormal. Severe mitral valve regurgitation. No evidence  of mitral stenosis. There is severe holosystolic prolapse of multiple  scallops of the posterior leaflet of the   mitral valve.   6. The aortic valve is grossly normal. There is mild calcification of the  aortic valve. Aortic valve regurgitation is not visualized. No aortic  stenosis is present.   7. The inferior vena cava is dilated in size with <50% respiratory  variability, suggesting right atrial pressure of 15 mmHg.      Neuro/Psych neg Seizures    GI/Hepatic ,neg GERD  ,,(+) neg Cirrhosis        Endo/Other    Renal/GU Renal disease     Musculoskeletal   Abdominal   Peds  Hematology   Anesthesia Other Findings    Reproductive/Obstetrics                              Anesthesia Physical Anesthesia Plan  ASA: 3  Anesthesia Plan: MAC   Post-op Pain Management:    Induction: Intravenous  PONV Risk Score and Plan: 2  Airway Management Planned: Natural Airway and Nasal Cannula  Additional Equipment:   Intra-op Plan:   Post-operative Plan:   Informed Consent: I have reviewed the patients History and Physical, chart, labs and discussed the procedure including the risks, benefits and alternatives for the proposed anesthesia with the patient or authorized representative who has indicated his/her understanding and acceptance.     Dental advisory given  Plan Discussed with: CRNA  Anesthesia Plan Comments:          Anesthesia Quick Evaluation

## 2024-01-06 NOTE — Interval H&P Note (Signed)
 History and Physical Interval Note:  01/06/2024 9:22 AM  Eric Dixon  has presented today for surgery, with the diagnosis of afib, valve.  The various methods of treatment have been discussed with the patient and family. After consideration of risks, benefits and other options for treatment, the patient has consented to  Procedure(s): TRANSESOPHAGEAL ECHOCARDIOGRAM (N/A) CARDIOVERSION (N/A) as a surgical intervention.  The patient's history has been reviewed, patient examined, no change in status, stable for surgery.  I have reviewed the patient's chart and labs.  Questions were answered to the patient's satisfaction.     [employer]

## 2024-01-06 NOTE — Anesthesia Procedure Notes (Signed)
 Procedure Name: MAC Date/Time: 01/06/2024 10:28 AM  Performed by: Viviana Almarie DASEN, CRNAPre-anesthesia Checklist: Patient identified, Suction available, Patient being monitored, Emergency Drugs available and Timeout performed Patient Re-evaluated:Patient Re-evaluated prior to induction Oxygen Delivery Method: Nasal cannula Induction Type: IV induction Placement Confirmation: positive ETCO2

## 2024-01-06 NOTE — Transfer of Care (Signed)
 Immediate Anesthesia Transfer of Care Note  Patient: Eric Dixon  Procedure(s) Performed: TRANSESOPHAGEAL ECHOCARDIOGRAM CARDIOVERSION  Patient Location: PACU and Cath Lab  Anesthesia Type:MAC  Level of Consciousness: sedated  Airway & Oxygen Therapy: Patient Spontanous Breathing and Patient connected to nasal cannula oxygen  Post-op Assessment: Report given to RN and Post -op Vital signs reviewed and stable  Post vital signs: Reviewed and stable  Last Vitals:  Vitals Value Taken Time  BP 94/66 1101  Temp 36.5   Pulse 67   Resp 11   SpO2 96     Last Pain:  Vitals:   01/06/24 0925  TempSrc:   PainSc: 0-No pain         Complications: No notable events documented.

## 2024-01-06 NOTE — Telephone Encounter (Signed)
 Order placed for coronary CTA. Spoke with patient, he will review instructions in my chart.

## 2024-01-06 NOTE — CV Procedure (Signed)
   Transesophageal Echocardiogram  Indications: Mitral valve prolapse with severe regurgitation  Time out performed  During this procedure the patient was administered propofol under anesthesiology supervision to achieve and maintain moderate sedation.  The patient's heart rate, blood pressure, and oxygen saturation are monitored continuously during the procedure.   Findings:  Left Ventricle: Ejection fraction 65% normal  Mitral Valve: Flail P2 segment with severe eccentric mitral regurgitation with pulmonary vein flow reversal  Aortic Valve: Trileaflet normal  Tricuspid Valve: Mild TR  Left Atrium: Normal, no left atrial appendage thrombus  Right Atrium: Normal  Intraatrial septum: Normal, no shunt  Bubble Contrast Study: Normal bubble study.  No shunt  Oneil Parchment, MD     Electrical Cardioversion Procedure Note Eric Dixon [id number] 05-22-1960  Procedure: Electrical Cardioversion Indications:  Atrial Fibrillation  Time Out: Verified patient identification, verified procedure,medications/allergies/relevent history reviewed, required imaging and test results available.  Performed  Procedure Details  The patient was NPO after midnight. Anesthesia was administered at the beside  by Dr.Buck with propofol.  Cardioversion was performed with synchronized biphasic defibrillation via AP pads with 200 joules.  1 attempt(s) were performed.  The patient converted to normal sinus rhythm. The patient tolerated the procedure well   IMPRESSION:  Successful cardioversion of atrial fibrillation    Oneil Parchment 01/06/2024, 11:01 AM

## 2024-01-12 ENCOUNTER — Ambulatory Visit (HOSPITAL_COMMUNITY)
Admission: RE | Admit: 2024-01-12 | Discharge: 2024-01-12 | Disposition: A | Discharge status: Home / Self Care | Source: Ambulatory Visit | Attending: Physician Assistant | Admitting: Physician Assistant

## 2024-01-12 DIAGNOSIS — I2510 Atherosclerotic heart disease of native coronary artery without angina pectoris: Secondary | ICD-10-CM | POA: Diagnosis not present

## 2024-01-12 MED ORDER — IOHEXOL 350 MG/ML SOLN
100.0000 mL | Freq: Once | INTRAVENOUS | Status: AC | PRN
Start: 2024-01-12 — End: 2024-01-12
  Administered 2024-01-12: 100 mL via INTRAVENOUS

## 2024-01-12 MED ORDER — NITROGLYCERIN 0.4 MG SL SUBL
0.8000 mg | SUBLINGUAL_TABLET | Freq: Once | SUBLINGUAL | Status: AC
  Administered 2024-01-12: 0.8 mg via SUBLINGUAL

## 2024-01-13 ENCOUNTER — Ambulatory Visit: Payer: Self-pay | Admitting: Physician Assistant

## 2024-01-13 DIAGNOSIS — J90 Pleural effusion, not elsewhere classified: Secondary | ICD-10-CM

## 2024-01-14 NOTE — Progress Notes (Unsigned)
 Cardiology Office Note    Date:  01/15/2024  ID:  Eric Dixon, DOB 14-Jul-1960, MRN [id number] PCP:  Onita Rush, MD  Cardiologist:  Oneil Parchment, MD  Electrophysiologist:  None   Chief Complaint: Follow up for atrial fibrillation and mitral valve prolapse   History of Present Illness: .   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  Coronary artery calcium  score was 816 in 05/2020, following patient was seen by Dr. Parchment with coronary CTA in 08/2020 showing coronary artery calcium  score 622, 95th percentile, mild disease in RCA, ramus and LAD, managed medically.  Patient is on rosuvastatin  and Zetia .  Patient was seen in clinic on 12/23/2023 by Raphael Bring, PA and found to be in new onset atrial fibrillation with heart rate in the high 90s.  Patient was not acutely aware but had noticed over the last year that his endurance had been down.  Patient had noted some increased shortness of breath during a trip in the summer with higher altitude however was able to ride his [employer] and remain active.  Patient was noted to have a systolic murmur suspicious for mitral regurgitation at the apex but was also heard throughout the precordium.  Echocardiogram on 12/29/2023 indicated LVEF 55 to 60%, no RWMA, diastolic parameters were indeterminate, RV systolic function and size was normal, normal PASP, LA was moderately dilated, RA was moderately dilated there was severe prolapse of the posterior leaflet with severe eccentric MR, no evidence of mitral valve stenosis, severe holosystolic prolapse of multiple scallops of the posterior leaflet of the mitral valve, aortic valve regurgitation was not visualized, no stenosis was present.  On 01/06/2024 patient had TEE guided DCCV.  TEE indicated LVEF 65% with flail P2 segment with severe eccentric mitral  regurgitation with pulmonary vein flow reversal, mild TR, normal bubble study no shunt.  Patient had successful cardioversion at 200 J.  Patient was loaded on oral amiodarone given significant valve regurgitation.  Coronary CTA on 11//2025 indicated minimal to mild nonobstructive CAD, coronary artery calcium  score 1391, placing the patient in the 97th percentile for age, race and sex matched control.  Today he presents for follow-up.  He reports that he is doing well overall.  Notes that he has had some difficulty sleeping since his cardioversion and starting on amiodarone.  Notes that he is unable to lay on his left side as he feels this makes him slightly more dyspneic, notes when he lays on his right side this resolves.  He denies any lower extremity edema.  Reports that he has had some strange dreams with starting of amiodarone and initially felt sluggish with starting of metoprolol  however notes this has resolved.  Patient reports that he played tennis yesterday and overall tolerated well.  He does report some mild dyspnea on exertion.  He denies any palpitations or feeling of irregular heartbeats, notes that his heart rate has been well-controlled averaging between 50 and 60 bpm.  Patient reports he received an email from [facility] last night, has a tentative appointment next week although will need to work on his work schedule.  Labwork independently reviewed: 01/01/2024: Sodium 139, potassium 4.9, creatinine 1.23, AST 37, ALT 52 ROS: .   Today he denies chest pain, lower extremity edema, fatigue, palpitations, melena, hematuria, hemoptysis, diaphoresis, weakness, presyncope, syncope, orthopnea, and PND.  All other systems are reviewed and otherwise negative. Studies  Reviewed: SABRA   EKG:  EKG is ordered today, personally reviewed, demonstrating  EKG Interpretation Date/Time:  Friday January 15 2024 07:59:22 EST Ventricular Rate:  58 PR Interval:  188 QRS Duration:  100 QT Interval:  460 QTC  Calculation: 451 R Axis:   16  Text Interpretation: Sinus bradycardia Nonspecific ST and T wave abnormality When compared with ECG of 06-Jan-2024 11:06, No significant change was found Confirmed by Lani Mendiola [phone]) on 01/15/2024 8:01:36 AM   CV Studies: Cardiac studies reviewed are outlined and summarized above. Otherwise please see EMR for full report. Cardiac Studies & Procedures   ______________________________________________________________________________________________     ECHOCARDIOGRAM  ECHOCARDIOGRAM COMPLETE 12/29/2023  Narrative ECHOCARDIOGRAM REPORT    Patient Name:   Eric Dixon Date of Exam: 12/29/2023 Medical Rec #:  [id number]      Height:       73.0 in Accession #:    [id number]     Weight:       200.0 lb Date of Birth:  05/10/1960      BSA:          2.152 m Patient Age:    62 years       BP:           171/115 mmHg Patient Gender: M              HR:           113 bpm. Exam Location:  Outpatient  Procedure: 2D Echo, 3D Echo, Cardiac Doppler and Color Doppler (Both Spectral and Color Flow Doppler were utilized during procedure).  Indications:    systolic murmur  History:        Patient has no prior history of Echocardiogram examinations. CAD, Arrythmias:Atrial Fibrillation; Risk Factors:Hypertension and Dyslipidemia.  Sonographer:    Philomena [name] RDCS Referring Phys: 64 DAYNA N DUNN  IMPRESSIONS   1. Left ventricular ejection fraction, by estimation, is 55 to 60%. The left ventricle has normal function. The left ventricle has no regional wall motion abnormalities. Left ventricular diastolic parameters are indeterminate. 2. Right ventricular systolic function is normal. The right ventricular size is normal. There is normal pulmonary artery systolic pressure. 3. Left atrial size was moderately dilated. 4. Right atrial size was moderately dilated. 5. Severe prolapse of posterior leaflet with severe eccentric MR. The mitral valve is abnormal. Severe  mitral valve regurgitation. No evidence of mitral stenosis. There is severe holosystolic prolapse of multiple scallops of the posterior leaflet of the mitral valve. 6. The aortic valve is grossly normal. There is mild calcification of the aortic valve. Aortic valve regurgitation is not visualized. No aortic stenosis is present. 7. The inferior vena cava is dilated in size with <50% respiratory variability, suggesting right atrial pressure of 15 mmHg.  Comparison(s): No prior Echocardiogram.  Conclusion(s)/Recommendation(s): Severe eccentric mitral regurgitation due to prolapse of posterior mitral valve leaflet. Recommend TEE for further evaluation. Findings communicated to Dayna Dunn, PA.  FINDINGS Left Ventricle: Left ventricular ejection fraction, by estimation, is 55 to 60%. The left ventricle has normal function. The left ventricle has no regional wall motion abnormalities. The left ventricular internal cavity size was normal in size. There is borderline left ventricular hypertrophy. Left ventricular diastolic parameters are indeterminate.  Right Ventricle: The right ventricular size is normal. No increase in right ventricular wall thickness. Right ventricular systolic function is normal. There is normal pulmonary artery systolic pressure. The tricuspid regurgitant velocity is 2.15 m/s, and with an assumed right atrial pressure of  15 mmHg, the estimated right ventricular systolic pressure is 33.5 mmHg.  Left Atrium: Left atrial size was moderately dilated.  Right Atrium: Right atrial size was moderately dilated.  Pericardium: There is no evidence of pericardial effusion.  Mitral Valve: Severe prolapse of posterior leaflet with severe eccentric MR. The mitral valve is abnormal. There is severe holosystolic prolapse of multiple scallops of the posterior leaflet of the mitral valve. There is mild thickening of the mitral valve leaflet(s). Severe mitral valve regurgitation. No evidence of  mitral valve stenosis.  Tricuspid Valve: The tricuspid valve is normal in structure. Tricuspid valve regurgitation is mild . No evidence of tricuspid stenosis.  Aortic Valve: The aortic valve is grossly normal. There is mild calcification of the aortic valve. Aortic valve regurgitation is not visualized. No aortic stenosis is present.  Pulmonic Valve: The pulmonic valve was not well visualized. Pulmonic valve regurgitation is trivial. No evidence of pulmonic stenosis.  Aorta: The aortic root, ascending aorta, aortic arch and descending aorta are all structurally normal, with no evidence of dilitation or obstruction.  Venous: The inferior vena cava is dilated in size with less than 50% respiratory variability, suggesting right atrial pressure of 15 mmHg.  IAS/Shunts: The atrial septum is grossly normal.  Additional Comments: 3D was performed not requiring image post processing on an independent workstation and was normal.   LEFT VENTRICLE PLAX 2D LVIDd:         4.97 cm   Diastology LVIDs:         3.48 cm   LV e' medial:    15.00 cm/s LV PW:         1.14 cm   LV E/e' medial:  10.8 LV IVS:        1.09 cm   LV e' lateral:   15.00 cm/s LVOT diam:     2.29 cm   LV E/e' lateral: 10.8 LV SV:         45 LV SV Index:   21 LVOT Area:     4.12 cm  3D Volume EF: 3D EF:        53 % LV EDV:       189 ml LV ESV:       89 ml LV SV:        101 ml  RIGHT VENTRICLE             IVC RV Basal diam:  3.11 cm     IVC diam: 2.27 cm RV Mid diam:    2.63 cm RV S prime:     10.20 cm/s TAPSE (M-mode): 1.7 cm  LEFT ATRIUM              Index        RIGHT ATRIUM           Index LA diam:        4.09 cm  1.90 cm/m   RA Area:     26.10 cm LA Vol (A2C):   104.0 ml 48.34 ml/m  RA Volume:   82.10 ml  38.16 ml/m LA Vol (A4C):   79.7 ml  37.04 ml/m LA Biplane Vol: 91.3 ml  42.43 ml/m AORTIC VALVE LVOT Vmax:   64.00 cm/s LVOT Vmean:  43.600 cm/s LVOT VTI:    0.109 m  AORTA Ao Root diam: 3.48 cm Ao  Asc diam:  3.55 cm  MV E velocity: 162.00 cm/s  TRICUSPID VALVE TR Peak grad:   18.5 mmHg TR Vmax:  215.00 cm/s  SHUNTS Systemic VTI:  0.11 m Systemic Diam: 2.29 cm  Shelda Bruckner MD Electronically signed by Shelda Bruckner MD Signature Date/Time: 12/29/2023/5:48:01 PM    Final   TEE  ECHO TEE 01/06/2024  Narrative TRANSESOPHOGEAL ECHO REPORT    Patient Name:   Eric Dixon Date of Exam: 01/06/2024 Medical Rec #:  [id number]      Height:       73.0 in Accession #:    [id number]     Weight:       200.0 lb Date of Birth:  04-01-1960      BSA:          2.152 m Patient Age:    62 years       BP:           137/98 mmHg Patient Gender: M              HR:           136 bpm. Exam Location:  Outpatient  Procedure: Transesophageal Echo, 3D Echo, Cardiac Doppler, Color Doppler and Saline Contrast Bubble Study (Both Spectral and Color Flow Doppler were utilized during procedure).  Indications:     Atrial fibrillation/Mitral Regurgitation  History:         Patient has prior history of Echocardiogram examinations, most recent 12/29/2023. CAD; Risk Factors:Hypertension and Dyslipidemia.  Sonographer:     Philomena [name] Referring Phys:  GLADE RAPHAEL SAILOR DUNN Diagnosing Phys: Oneil Parchment MD  PROCEDURE: After discussion of the risks and benefits of a TEE, an informed consent was obtained from the patient. The transesophogeal probe was passed without difficulty through the esophogus of the patient. Imaged were obtained with the patient in a left lateral decubitus position. Sedation performed by different physician. The patient was monitored while under deep sedation. Anesthestetic sedation was provided intravenously by Anesthesiology: 477mg  of Propofol, 100mg  of Lidocaine. The patient's vital signs; including heart rate, blood pressure, and oxygen saturation; remained stable throughout the procedure. The patient developed no complications during the procedure. A  successful direct current cardioversion was performed at 200 joules with 1 attempt.  IMPRESSIONS   1. Left ventricular ejection fraction, by estimation, is 60 to 65%. The left ventricle has normal function. The left ventricle has no regional wall motion abnormalities. 2. Right ventricular systolic function is normal. The right ventricular size is normal. There is normal pulmonary artery systolic pressure. The estimated right ventricular systolic pressure is 29.2 mmHg. 3. Left atrial size was mildly dilated. No left atrial/left atrial appendage thrombus was detected. 4. Right atrial size was mildly dilated. 5. The mitral valve is myxomatous. Severe mitral valve regurgitation. No evidence of mitral stenosis. There is severe holosystolic prolapse of the middle scallop of the posterior leaflet of the mitral valve. 6. The aortic valve is tricuspid. Aortic valve regurgitation is not visualized. No aortic stenosis is present. 7. The inferior vena cava is normal in size with greater than 50% respiratory variability, suggesting right atrial pressure of 3 mmHg. 8. Agitated saline contrast bubble study was negative, with no evidence of any interatrial shunt. 9. 3D performed of the mitral valve and demonstrates P2 flail leaflet with severe MR.  FINDINGS Left Ventricle: Left ventricular ejection fraction, by estimation, is 60 to 65%. The left ventricle has normal function. The left ventricle has no regional wall motion abnormalities. The left ventricular internal cavity size was normal in size. There is no left ventricular hypertrophy.  Right Ventricle: The right ventricular size is normal.  No increase in right ventricular wall thickness. Right ventricular systolic function is normal. There is normal pulmonary artery systolic pressure. The tricuspid regurgitant velocity is 2.56 m/s, and with an assumed right atrial pressure of 3 mmHg, the estimated right ventricular systolic pressure is 29.2 mmHg.  Left  Atrium: Left atrial size was mildly dilated. No left atrial/left atrial appendage thrombus was detected.  Right Atrium: Right atrial size was mildly dilated.  Pericardium: There is no evidence of pericardial effusion.  Mitral Valve: The mitral valve is myxomatous. There is severe holosystolic prolapse of the middle scallop of the posterior leaflet of the mitral valve. Severe mitral valve regurgitation, with anteriorly-directed jet. No evidence of mitral valve stenosis.  Tricuspid Valve: The tricuspid valve is normal in structure. Tricuspid valve regurgitation is mild . No evidence of tricuspid stenosis.  Aortic Valve: The aortic valve is tricuspid. Aortic valve regurgitation is not visualized. No aortic stenosis is present.  Pulmonic Valve: The pulmonic valve was normal in structure. Pulmonic valve regurgitation is not visualized. No evidence of pulmonic stenosis.  Aorta: The aortic root is normal in size and structure.  Venous: The inferior vena cava is normal in size with greater than 50% respiratory variability, suggesting right atrial pressure of 3 mmHg.  IAS/Shunts: No atrial level shunt detected by color flow Doppler. Agitated saline contrast was given intravenously to evaluate for intracardiac shunting. Agitated saline contrast bubble study was negative, with no evidence of any interatrial shunt. There is no evidence of a patent foramen ovale. There is no evidence of an atrial septal defect.  Additional Comments: 3D was performed not requiring image post processing on an independent workstation and was abnormal. Spectral Doppler performed.  TRICUSPID VALVE TR Peak grad:   26.2 mmHg TR Vmax:        256.00 cm/s  Oneil Parchment MD Electronically signed by Oneil Parchment MD Signature Date/Time: 01/06/2024/1:57:33 PM    Final    CT SCANS  CT CORONARY MORPH W/CTA COR W/SCORE 01/12/2024  Narrative CLINICAL DATA:  63 yo male for CAD monitoring, abnormal stress imaging > 83yrs Pre-op  noncardiac surgery, moderate risk  EXAM: Cardiac/Coronary CTA  TECHNIQUE: A non-contrast, gated CT scan was obtained with axial slices of 2.5 mm through the heart for calcium  scoring. Calcium  scoring was performed using the Agatston method. A 120 kV prospective, gated, contrast cardiac CT scan was obtained. Gantry rotation speed was 230 msec and collimation was 0.63 mm. Two sublingual nitroglycerin  tablets (0.8 mg) were given. The 3D data set was reconstructed with motion correction for the best systolic or diastolic phase. Images were analyzed on a dedicated workstation using MPR, MIP, and VRT modes. The patient received OMNIPAQUE  IOHEXOL  350 MG/ML SOLN of contrast.  FINDINGS: Image quality: Excellent.  Noise artifact is: Limited.  Coronary arteries: Normal coronary origins.  Right dominance.  Right Coronary Artery: Dominant. Proximal and mid-vessel calcification with minimal 1-24% stenosis. Normal R-PLB and R-PDA branches.  Left Main Coronary Artery: Short vessel, no disease. Bifurcates into the LAD and LCx arteries.  Left Anterior Descending Coronary Artery: Large anterior artery that wraps around the apex. Mild mixed 25-49% proximal stenosis. Small D1 branch, no disease.  Left Circumflex Artery: AV groove vessel, no disease. Large OM branch, minimal calcification without stenosis.  Aorta: Normal size, 35 mm at the mid ascending aorta (level of the PA bifurcation) measured double oblique. No calcifications. No dissection.  Aortic Valve: Trileaflet. No calcifications.  Other findings:  Normal pulmonary vein drainage into the left  atrium.  Normal left atrial appendage without a thrombus.  Dilated main pulmonary artery to 30 mm, possibly suggesting pulmonary hypertension.  Extra-cardiac findings: See attached radiology report for non-cardiac structures.  IMPRESSION: 1. Minimal to mild non-obstructive CAD, CADRADS = 2.  2. Normal coronary origin with  right dominance.  3. Coronary artery calcium  score is 1391, which places the patient in the 97th percentile for age/race and sex-matched controls (MESA).  4. Dilated main pulmonary artery to 30 mm, possibly suggesting pulmonary hypertension.  5. Continued aggressive cardiovascular risk factor modification is recommended.  RECOMMENDATIONS: 1. CAD-RADS 0: No evidence of CAD (0%). Consider non-atherosclerotic causes of chest pain.  2. CAD-RADS 1: Minimal non-obstructive CAD (0-24%). Consider non-atherosclerotic causes of chest pain. Consider preventive therapy and risk factor modification.  3. CAD-RADS 2: Mild non-obstructive CAD (25-49%). Consider non-atherosclerotic causes of chest pain. Consider preventive therapy and risk factor modification.  4. CAD-RADS 3: Moderate stenosis. Consider symptom-guided anti-ischemic pharmacotherapy as well as risk factor modification per guideline directed care. Additional analysis with CT FFR will be submitted.  5. CAD-RADS 4: Severe stenosis. (70-99% or > 50% left main). Cardiac catheterization or CT FFR is recommended. Consider symptom-guided anti-ischemic pharmacotherapy as well as risk factor modification per guideline directed care. Invasive coronary angiography recommended with revascularization per published guideline statements.  6. CAD-RADS 5: Total coronary occlusion (100%). Consider cardiac catheterization or viability assessment. Consider symptom-guided anti-ischemic pharmacotherapy as well as risk factor modification per guideline directed care.  7. CAD-RADS N: Non-diagnostic study. Obstructive CAD can't be excluded. Alternative evaluation is recommended.   Electronically Signed By: [name] M.D. On: 01/13/2024 10:20   CT SCANS  CT CORONARY FRACTIONAL FLOW RESERVE DATA PREP 08/22/2020  Narrative EXAM: FFRCT ANALYSIS  FINDINGS: FFRct analysis was performed on the original cardiac CT angiogram dataset.  Diagrammatic representation of the FFRct analysis is provided in a separate PDF document in PACS. This dictation was created using the PDF document and an interactive 3D model of the results. 3D model is not available in the EMR/PACS. Normal FFR range is >0.80.  1. Left Main: No significant stenosis. LM FFR = 0.99.  2. LAD: No significant stenosis. Proximal FFR = 0.98, Mid FFR = 0.90, Distal FFR = 0.87.  3. LCX: FNo significant stenosis. Proximal FFR = 0.98, Mid FFR = 0.98, Distal FFR = 0.95.  4. RCA: No significant stenosis. Proximal FFR = 0.99, Mid FFR = 0.99, Distal FFR = 0.99.  IMPRESSION: 1.  Coronary CTA FFR analysis demonstrates no flow limiting lesions.  Wilbert Bihari, MD   Electronically Signed By: Wilbert Bihari On: 08/22/2020 16:15   CT CORONARY MORPH W/CTA COR W/SCORE 08/22/2020  Addendum 08/22/2020  1:01 PM ADDENDUM REPORT: 08/22/2020 12:58  EXAM: Cardiac/Coronary  CT  TECHNIQUE: The patient was scanned on a [employer].  FINDINGS: A 120 kV prospective scan was triggered in the descending thoracic aorta at 111 HU's. Axial non-contrast 3 mm slices were carried out through the heart. The data set was analyzed on a dedicated work station and scored using the Agatson method. Gantry rotation speed was 250 msecs and collimation was .6 mm. No beta blockade and 0.8 mg of sl NTG was given. The 3D data set was reconstructed in 5% intervals of the 67-82 % of the R-R cycle. Diastolic phases were analyzed on a dedicated work station using MPR, MIP and VRT modes. The patient received 80 cc of contrast.  Aorta:  Normal size.  No calcifications.  No dissection.  Aortic Valve:  Trileaflet.  No calcifications.  Coronary Arteries:  Normal coronary origin.  Right dominance.  RCA is a large dominant artery that gives rise to PDA and PLVB. There is mild calcified plaque in the proxmal RCA wtih associated stenosis of 25-49% There is minimal calcified plaque in  the mid and distal RCA with associated stenosis of <25%.  Left main is a large artery that gives rise to LAD, Ramus and LCX arteries. There is no plaque.  Ramus is a moderate sized vessel with mild calcified plaque throughout the vessel with associated stenosis of 25-49%.  LAD is a large vessel that gives rise to 2 moderate sized diagonal branches. There is mild calcified plaque in the proximal and mid LAD with associated stenosis of 25-49%.  LCX is a non-dominant artery that gives rise to one large OM1 branch. There is no plaque.  Other findings:  Normal pulmonary vein drainage into the left atrium.  Normal let atrial appendage without a thrombus.  Normal size of the pulmonary artery.  IMPRESSION: 1. Coronary calcium  score of 622. This was 95th percentile for age and sex matched control.  2.  Mild atherosclerosis.  CAD RADS 2.  3.  Consider non atherosclerotic causes of chest pain.  4.  Recommend preventive therapy and risk factor modification.  5.  This study has been submitted for FFR flow analysis.  Wilbert Bihari   Electronically Signed By: Wilbert Bihari On: 08/22/2020 12:58  Narrative EXAM: OVER-READ INTERPRETATION  CT CHEST  The following report is an over-read performed by radiologist Dr. Toribio Aye of [facility] on 08/22/2020. This over-read does not include interpretation of cardiac or coronary anatomy or pathology. The coronary CTA interpretation by the cardiologist is attached.  COMPARISON:  None.  FINDINGS: Within the visualized portions of the thorax there are no suspicious appearing pulmonary nodules or masses, there is no acute consolidative airspace disease, no pleural effusions, no pneumothorax and no lymphadenopathy. Visualized portions of the upper abdomen are unremarkable. There are no aggressive appearing lytic or blastic lesions noted in the visualized portions of the skeleton.  IMPRESSION: No significant  incidental noncardiac findings are noted.  Electronically Signed: By: Toribio Aye M.D. On: 08/22/2020 10:13     ______________________________________________________________________________________________       Current Reported Medications:.    Current Meds  Medication Sig   amiodarone (PACERONE) 200 MG tablet Take 1 tablet (200 mg total) by mouth daily.   apixaban (ELIQUIS) 5 MG TABS tablet Take 1 tablet (5 mg total) by mouth 2 (two) times daily.   ezetimibe  (ZETIA ) 10 MG tablet TAKE 1 TABLET(10 MG) BY MOUTH DAILY   IBUPROFEN PO Take by mouth as needed.   MELATONIN PO Take by mouth as needed.   metoprolol  succinate (TOPROL  XL) 25 MG 24 hr tablet Take 1 tablet (25 mg total) by mouth daily.   rosuvastatin  (CRESTOR ) 20 MG tablet TAKE 1 TABLET(20 MG) BY MOUTH DAILY   [DISCONTINUED] amiodarone (PACERONE) 200 MG tablet Take 2 tablets (400 mg total) by mouth 2 (two) times daily for 5 days, THEN 1 tablet (200 mg total) 2 (two) times daily for 7 days, THEN 1 tablet (200 mg total) daily.   Physical Exam:    VS:  BP 132/78   Pulse (!) 58   Ht 6' 1 (1.854 m)   Wt 192 lb 9.6 oz (87.4 kg)   SpO2 96%   BMI 25.41 kg/m    Wt Readings from Last 3 Encounters:  01/15/24 192 lb 9.6 oz (87.4 kg)  12/23/23 200 lb (90.7 kg)  03/20/23 200 lb (90.7 kg)    GEN: Well nourished, well developed in no acute distress NECK: No JVD; right carotid bruit  CARDIAC: RRR, prominent holosystolic murmur best heard at apex although heard across precordium, no rubs or gallops RESPIRATORY:  Clear to auscultation without rales, wheezing or rhonchi  ABDOMEN: Soft, non-tender, non-distended EXTREMITIES:  No edema; No acute deformity     Asessement and Plan:SABRA    Mitral valve regurgitation/prolapse: TEE on 01/06/2024 indicated flail P2 segment with severe eccentric mitral regurgitation with pulmonary vein flow reversal. Patient has urgently been referred to [facility] for consideration of mini MVR, reports he has  an appointment next week however may need to change depending on his work schedule.  Is eager to follow-up with [facility].  Patient reports that he is overall doing well, does note some mild dyspnea on exertion such as when climbing chairs, notes that he was able to play tennis last night and tolerated well.  He does note some shortness of breath when he lays down on his left side, relieved when wearing on his right.  He appears euvolemic and well compensated on exam. Continue Eliquis 5 mg twice daily and metoprolol  succinate 25 mg daily.  Persistent atrial fibrillation: S/p DCCV on 01/06/2024.  Following patient underwent amiodarone loading given concern for recurrent atrial fibrillation given mitral valve regurgitation and prolapse. EKG today indicates sinus bradycardia at 58 bpm.  Patient reports that his heart rate at home has been very well-controlled, averaging between 50 and 60 bpm.  Notes that he did have some increased fatigue with starting of metoprolol , has noted improvement.  Discussed with patient adjusting to metoprolol  taking at night, he will trial.  He denies any bleeding problems on Eliquis. Continue Eliquis 5 mg twice daily, metoprolol  succinate 25 mg daily and amiodarone, to reduce to 200 mg daily next week, refill provided.  CAD: Coronary CTA on 01/12/2024 indicated minimal to mild nonobstructive CAD, coronary artery calcium  score 1391, placing the patient in 52 percentile for age, race and sex matched control. Stable with no anginal symptoms. No indication for ischemic evaluation.  Heart healthy diet and regular cardiovascular exercise encouraged.    Right carotid bruit: Likely radiation from a murmur, carotid duplex currently pending.  Hyperlipidemia: Last lipid profile in 02/18/2023 indicated total cholesterol 140, HDL 54, triglycerides 77 and LDL 71.  Monitored and managed per PCP. Discussed LDL goal less than 70.   Disposition: F/u with Dr. Jeffrie or Tynell Winchell, NP in 8 weeks pending  scheduling with [facility].   Signed, Taurus Alamo D Mattis Featherly, NP

## 2024-01-15 ENCOUNTER — Other Ambulatory Visit (HOSPITAL_COMMUNITY): Payer: Self-pay

## 2024-01-15 ENCOUNTER — Ambulatory Visit: Attending: Cardiology | Admitting: Cardiology

## 2024-01-15 ENCOUNTER — Encounter: Payer: Self-pay | Admitting: Cardiology

## 2024-01-15 VITALS — BP 132/78 | HR 58 | Ht 73.0 in | Wt 192.6 lb

## 2024-01-15 DIAGNOSIS — I340 Nonrheumatic mitral (valve) insufficiency: Secondary | ICD-10-CM

## 2024-01-15 DIAGNOSIS — R0989 Other specified symptoms and signs involving the circulatory and respiratory systems: Secondary | ICD-10-CM

## 2024-01-15 DIAGNOSIS — I2510 Atherosclerotic heart disease of native coronary artery without angina pectoris: Secondary | ICD-10-CM | POA: Diagnosis not present

## 2024-01-15 DIAGNOSIS — E782 Mixed hyperlipidemia: Secondary | ICD-10-CM

## 2024-01-15 DIAGNOSIS — I4819 Other persistent atrial fibrillation: Secondary | ICD-10-CM

## 2024-01-15 MED ORDER — AMIODARONE HCL 200 MG PO TABS
200.0000 mg | ORAL_TABLET | Freq: Every day | ORAL | 0 refills | Status: AC
  Filled 2024-01-15 – 2024-01-17 (×2): qty 90, 90d supply, fill #0
  Filled 2024-01-31: qty 30, 30d supply, fill #0
  Filled 2024-02-26: qty 30, 30d supply, fill #1
  Filled 2024-03-28: qty 30, 30d supply, fill #2

## 2024-01-15 NOTE — Patient Instructions (Signed)
 Medication Instructions:  Refill for Amiodarone  200 mg sent to your pharmacy.  *If you need a refill on your cardiac medications before your next appointment, please call your pharmacy*  Lab Work: none If you have labs (blood work) drawn today and your tests are completely normal, you will receive your results only by: MyChart Message (if you have MyChart) OR A paper copy in the mail If you have any lab test that is abnormal or we need to change your treatment, we will call you to review the results.  Testing/Procedures: none  Follow-Up: At [facility], you and your health needs are our priority.  As part of our continuing mission to provide you with exceptional heart care, our providers are all part of one team.  This team includes your primary Cardiologist (physician) and Advanced Practice Providers or APPs (Physician Assistants and Nurse Practitioners) who all work together to provide you with the care you need, when you need it.  Your next appointment:   8 week(s)  Provider:   Oneil Parchment, MD, Katelyn West, NP, or Lonell Bring, [place]  We recommend signing up for the patient portal called MyChart.  Sign up information is provided on this After Visit Summary.  MyChart is used to connect with patients for Virtual Visits (Telemedicine).  Patients are able to view lab/test results, encounter notes, upcoming appointments, etc.  Non-urgent messages can be sent to your provider as well.   To learn more about what you can do with MyChart, go to [email or web address].   Other Instructions none

## 2024-01-17 ENCOUNTER — Other Ambulatory Visit (HOSPITAL_COMMUNITY): Payer: Self-pay

## 2024-01-18 ENCOUNTER — Other Ambulatory Visit: Payer: Self-pay

## 2024-01-18 ENCOUNTER — Other Ambulatory Visit (HOSPITAL_COMMUNITY): Payer: Self-pay

## 2024-01-20 ENCOUNTER — Other Ambulatory Visit (HOSPITAL_COMMUNITY): Payer: Self-pay

## 2024-01-20 DIAGNOSIS — I340 Nonrheumatic mitral (valve) insufficiency: Secondary | ICD-10-CM | POA: Diagnosis not present

## 2024-01-20 DIAGNOSIS — I4891 Unspecified atrial fibrillation: Secondary | ICD-10-CM | POA: Diagnosis not present

## 2024-01-20 MED ORDER — FUROSEMIDE 20 MG PO TABS
20.0000 mg | ORAL_TABLET | Freq: Every day | ORAL | 1 refills | Status: DC | PRN
  Filled 2024-01-20: qty 30, 30d supply, fill #0

## 2024-01-27 ENCOUNTER — Ambulatory Visit (HOSPITAL_COMMUNITY)
Admission: RE | Admit: 2024-01-27 | Discharge: 2024-01-27 | Disposition: A | Discharge status: Home / Self Care | Source: Ambulatory Visit | Attending: Physician Assistant | Admitting: Physician Assistant

## 2024-01-27 ENCOUNTER — Ambulatory Visit (HOSPITAL_COMMUNITY)

## 2024-01-27 DIAGNOSIS — R0989 Other specified symptoms and signs involving the circulatory and respiratory systems: Secondary | ICD-10-CM | POA: Diagnosis not present

## 2024-02-01 ENCOUNTER — Other Ambulatory Visit (HOSPITAL_COMMUNITY): Payer: Self-pay

## 2024-02-18 DIAGNOSIS — I4891 Unspecified atrial fibrillation: Secondary | ICD-10-CM | POA: Diagnosis not present

## 2024-02-18 DIAGNOSIS — E785 Hyperlipidemia, unspecified: Secondary | ICD-10-CM | POA: Diagnosis not present

## 2024-02-18 DIAGNOSIS — Z4659 Encounter for fitting and adjustment of other gastrointestinal appliance and device: Secondary | ICD-10-CM | POA: Diagnosis not present

## 2024-02-18 DIAGNOSIS — I340 Nonrheumatic mitral (valve) insufficiency: Secondary | ICD-10-CM | POA: Diagnosis not present

## 2024-02-18 DIAGNOSIS — I2510 Atherosclerotic heart disease of native coronary artery without angina pectoris: Secondary | ICD-10-CM | POA: Diagnosis not present

## 2024-02-18 DIAGNOSIS — Z9889 Other specified postprocedural states: Secondary | ICD-10-CM | POA: Diagnosis not present

## 2024-02-18 DIAGNOSIS — R918 Other nonspecific abnormal finding of lung field: Secondary | ICD-10-CM | POA: Diagnosis not present

## 2024-02-18 DIAGNOSIS — Z01810 Encounter for preprocedural cardiovascular examination: Secondary | ICD-10-CM | POA: Diagnosis not present

## 2024-02-18 DIAGNOSIS — I10 Essential (primary) hypertension: Secondary | ICD-10-CM | POA: Diagnosis not present

## 2024-02-18 DIAGNOSIS — G8918 Other acute postprocedural pain: Secondary | ICD-10-CM | POA: Diagnosis not present

## 2024-02-18 DIAGNOSIS — J9811 Atelectasis: Secondary | ICD-10-CM | POA: Diagnosis not present

## 2024-02-18 DIAGNOSIS — I272 Pulmonary hypertension, unspecified: Secondary | ICD-10-CM | POA: Diagnosis not present

## 2024-02-18 DIAGNOSIS — J948 Other specified pleural conditions: Secondary | ICD-10-CM | POA: Diagnosis not present

## 2024-02-18 DIAGNOSIS — I480 Paroxysmal atrial fibrillation: Secondary | ICD-10-CM | POA: Diagnosis not present

## 2024-02-18 DIAGNOSIS — J939 Pneumothorax, unspecified: Secondary | ICD-10-CM | POA: Diagnosis not present

## 2024-02-18 DIAGNOSIS — Z4682 Encounter for fitting and adjustment of non-vascular catheter: Secondary | ICD-10-CM | POA: Diagnosis not present

## 2024-02-18 DIAGNOSIS — I511 Rupture of chordae tendineae, not elsewhere classified: Secondary | ICD-10-CM | POA: Diagnosis not present

## 2024-02-18 DIAGNOSIS — Z452 Encounter for adjustment and management of vascular access device: Secondary | ICD-10-CM | POA: Diagnosis not present

## 2024-02-19 NOTE — Progress Notes (Signed)
 Anesthesia Transfer of Care Note  Patient: Eric Dixon  Procedures performed: ADULT, VALVULOPLASTY, MITRAL VALVE, VIA HEARTPORT, WITH CARDIOPULMONARY BYPASS; RADICALRECONSTRUCTION, WITH OR WITHOUT RING (Chest) ADULT, REPLACEMENT, MITRAL VALVE, VIA HEARTPORT, WITH CARDIOPULMONARY BYPASS, possible (Chest) OPERATIVE TISSUE ABLATION AND RECONSTRUCTION OF ATRIA, EXTENSIVE (EG, MAZE PROCEDURE); WITH CARDIOPULMONARY BYPASS (Chest)  Report given/handover to: ICU Nurse

## 2024-02-19 NOTE — Anesthesia Procedure Notes (Signed)
 Block: Truncal 02/19/2024 10:41 AM  Patient location: OR  Reason for placing block: (OR Block) performed for postoperative pain management as part of care plan discussed with SURGICAL team and patient.  Preanesthetic checklist: patient identified, IV checked, site marked, risks and benefits discussed, surgical consent, monitors and equipment checked, pre-op evaluation, timeout performed and anesthesia consent  Block type: Interpectoral Plane Block (PECS 1) and Serratus Anterior Laterality: rightPosition: supine Prep: ChloraPrep  Technique adjunct: Ultrasound guided Procedure performed using ultrasound guided technique. Injectate observed spreading well within intermuscular plane.  Permanent images saved Block Needle Needle type: Stimuplex  Needle length: 100 mm Block drug: liposomal bupivacaine 1.3% (Exparel), bupivacaine Block drug concentration: 0.5%%     Procedure Staff: Performing Provider: Vannie Paralee Dragon, MD (Attending, Fellow/Resident, or CRNA) Authorizing Provider: Shelva Fermin Glatter, MD (Attending)  Performed: attending and fellow under supervision  The teaching provider was present during the key and critical portions of this procedure and immediately available throughout the entire procedure.   ____________________________________________________________________________

## 2024-02-19 NOTE — Anesthesia Postprocedure Evaluation (Signed)
 Discharge from Anesthesia Care  Patient: Eric Dixon  Procedures performed: ADULT, VALVULOPLASTY, MITRAL VALVE, VIA HEARTPORT, WITH CARDIOPULMONARY BYPASS; RADICALRECONSTRUCTION, WITH OR WITHOUT RING (Chest) ADULT, REPLACEMENT, MITRAL VALVE, VIA HEARTPORT, WITH CARDIOPULMONARY BYPASS, possible (Chest) OPERATIVE TISSUE ABLATION AND RECONSTRUCTION OF ATRIA, EXTENSIVE (EG, MAZE PROCEDURE); WITH CARDIOPULMONARY BYPASS (Chest)  Anesthesia type: general Vitals Value Taken Time  BP    Temp    Pulse 86 02/19/24 11:21  Resp 33 02/19/24 11:21  SpO2 99 % 02/19/24 11:21  Vitals shown include unfiled device data. *If vital value is absent from grid, please refer to corresponding flowsheet vitals data  Anesthesia Post Evaluation  Patient participation: complete - patient cannot participate Pain management: adequate Airway patency: patent Respiratory status: acceptable and intubated Cardiovascular status: acceptable and hemodynamically stable Hydration status: acceptable    Recent Flowsheet Information: No vitals data found for the desired time range.

## 2024-02-19 NOTE — Anesthesia Procedure Notes (Signed)
 Central Venous Catheter Insertion 02/19/2024 6:58 AM Patient location: OR. Preanesthetic checklist: patient identified, IV checked, risks and benefits discussed, surgical consent, monitors and equipment checked, pre-op evaluation, anesthesia consent, hand hygiene performed and maximal sterile barrier technique followed (cap, mask, sterile gown/gloves) during CVC insertion Indications: central pressure monitoring, hemodynamic monitoring and surgery Additional CVC for CVP monitoring Position: Trendelenburg Skin prep agent completely dried prior to procedure. Landmarks identified Catheter size: 7 Fr Line length: 16 cm Insertion depth: 16 cm Length of exposed catheter: 0 cm  central line was placed.SLIC - single lumen inf cath Site/location: via introducer Laterality: right Procedure performed using ultrasound guided technique. Permanent images savedSterile probe cover used TEE was not used for confirmation. Manometry used for confirmation. Fluoro was not used for confirmation. Attempts: 1  Following insertion, line sutured and dressing applied. Post procedure assessment: blood return through all ports and free fluid flow. Post procedure complications: none.   Procedure Staff: Performing Provider: Vannie Paralee Dragon, MD Authorizing Provider: Shelva Fermin Glatter, MD  Performed by: attending and fellow under supervision  The teaching provider was present during the key and critical portions of this procedure and immediately available throughout the procedure.  ____________________________________________________________________________

## 2024-02-19 NOTE — Anesthesia Procedure Notes (Signed)
 Central Venous Catheter Insertion 02/19/2024 6:45 AM Patient location: OR. Preanesthetic checklist: patient identified, IV checked, risks and benefits discussed, surgical consent, monitors and equipment checked, pre-op evaluation, anesthesia consent, hand hygiene performed and maximal sterile barrier technique followed (cap, mask, sterile gown/gloves) during CVC insertion Indications: central pressure monitoring, hemodynamic monitoring and surgery Position: Trendelenburg Skin prep agent completely dried prior to procedure. Landmarks identified Catheter size: 9 Fr Line length: 12 cm Insertion depth: 12 cm Length of exposed catheter: 0 cm  central line was placed.MAC introducer - double lumen Site/location: internal jugular Laterality: right Procedure performed using ultrasound guided technique. Permanent images savedSterile probe cover used TEE used for confirmation. Manometry used for confirmation. Fluoro was not used for confirmation. Attempts: 1  Following insertion, line sutured and dressing applied. Post procedure assessment: blood return through all ports and free fluid flow. Post procedure complications: none.   Procedure Staff: Performing Provider: Vannie Paralee Dragon, MD Authorizing Provider: Shelva Fermin Glatter, MD  Performed by: attending and fellow under supervision  The teaching provider was present during the key and critical portions of this procedure and immediately available throughout the procedure.  ____________________________________________________________________________

## 2024-02-24 ENCOUNTER — Telehealth (HOSPITAL_COMMUNITY): Payer: Self-pay

## 2024-02-24 NOTE — Telephone Encounter (Signed)
 Outside/paper referral received by Dr Gaca from [place]. Insurance benefits and eligibility to be determined.

## 2024-02-24 NOTE — Telephone Encounter (Signed)
 Called patient to see if he was interested in Cardiac rehab.  Patient is interested.

## 2024-02-24 NOTE — Telephone Encounter (Signed)
 Pt returned phone call and stated that he is interested in the cardiac rehab program.

## 2024-02-25 ENCOUNTER — Telehealth: Payer: Self-pay | Admitting: Cardiology

## 2024-02-25 ENCOUNTER — Telehealth (HOSPITAL_COMMUNITY): Payer: Self-pay

## 2024-02-25 ENCOUNTER — Ambulatory Visit (HOSPITAL_COMMUNITY): Admission: RE | Admit: 2024-02-25 | Source: Ambulatory Visit | Admitting: Physician Assistant

## 2024-02-25 VITALS — BP 80/50 | HR 109 | Ht 73.0 in | Wt 188.0 lb

## 2024-02-25 DIAGNOSIS — Z79899 Other long term (current) drug therapy: Secondary | ICD-10-CM | POA: Diagnosis not present

## 2024-02-25 DIAGNOSIS — I4891 Unspecified atrial fibrillation: Secondary | ICD-10-CM

## 2024-02-25 DIAGNOSIS — I4819 Other persistent atrial fibrillation: Secondary | ICD-10-CM | POA: Diagnosis not present

## 2024-02-25 DIAGNOSIS — I484 Atypical atrial flutter: Secondary | ICD-10-CM

## 2024-02-25 DIAGNOSIS — D6869 Other thrombophilia: Secondary | ICD-10-CM

## 2024-02-25 DIAGNOSIS — Z5181 Encounter for therapeutic drug level monitoring: Secondary | ICD-10-CM | POA: Diagnosis not present

## 2024-02-25 NOTE — Telephone Encounter (Signed)
 Pt reports recent MVR/LAA/MAZE at [facility] last Friday.  Hx of afib previous to procedure. DCCV 10/29 and remained in NSR until last night when he started feeling fluttering in his chest.  He called [facility] cardiothoracic team and they started him on Lopressor  25 mg BID, took his first dose around 11 pm. He currently takes Toprol  25 mg daily as well. They stopped Amiodarone  post procedure. He is calling in to discuss how to medically manage this over the few day.....  does he remain on both Metoprolol 's?  Not symptomatic at this time. Aware I will discuss w/ provider.

## 2024-02-25 NOTE — Telephone Encounter (Signed)
 Dr Jeffrie spoke with pt:  Can you get Dr. Gertrude into the AFIB clinic today. I asked him to take his 200mg  AMIO BID (was stopped around CABG at [facility] but had been on for over a month prior) for the next 2 days then AMIO 200 every day therafter and lopressor  25 BID (Rx by [facility] yesterday). Stopped the Toprol  XL 25 for now. AFIB clinic would be great for ECG BP check etc. May need DCCV if does not respond.   Pt seeing afib clinic this afternoon, they will update his medication list

## 2024-02-25 NOTE — Patient Instructions (Signed)
 Amiodarone  200mg  twice a day today and tomorrow - resume normal dosing on Saturday   Cardioversion scheduled for: Friday December 19th   - Arrive at the [facility] ([address]  and check in with ADMITTING at 9:30 AM   - Do not eat or drink anything after midnight the night prior to your procedure.   - Take all your morning medication (except diabetic medications) with a sip of water prior to arrival.  - Do NOT miss any doses of your blood thinner - if you should miss a dose or take a dose more than 4 hours late -- please notify our office immediately.  - You will not be able to drive home after your procedure. Please ensure you have a responsible adult to drive you home. You will need someone with you for 24 hours post procedure.     - Expect to be in the procedural area approximately 2 hours.   - If you feel as if you go back into normal rhythm prior to scheduled cardioversion, please notify our office immediately.   If your procedure is canceled in the cardioversion suite you will be charged a cancellation fee.

## 2024-02-25 NOTE — Telephone Encounter (Signed)
 TEE does not require authorization for date of service : 02/26/2024 Reference # [id number]

## 2024-02-25 NOTE — Progress Notes (Signed)
 "   Primary Care Physician: Onita Rush, MD Primary Cardiologist: Oneil Parchment, MD Electrophysiologist: None  Referring Physician: Dr Parchment Eric Dixon is a 63 y.o. male with a history of CAD, HLD, VHD, atrial fibrillation who presents for follow up in the [facility].  The patient was initially diagnosed with atrial fibrillation 12/23/23 at a visit with Dayna Dunn. He had symptoms of dyspnea on exertion. He was started on Toprol  for rate control and Eliquis  for stroke prevention. He underwent TEE guided DCCV on 01/06/24. TEE showed severe MR with flail P2 segment. He was loaded on amiodarone . He underwent MV repair, MAZE, and LAAL at [facility] on 02/18/24.   He called [facility] with elevated heart rates and SOB this AM. His Toprol  was changed to Lopressor  and amiodarone  was increased to BID.    Patient presents today for follow up for atrial fibrillation and amiodarone  monitoring. He is in atrial flutter with mildly elevated rates. He has had some intermittent dizziness. There were no specific triggers that he could identify. No bleeding issues since resuming anticoagulation on 02/23/24.   Today, he denies symptoms of chest pain, shortness of breath, orthopnea, PND, lower extremity edema, presyncope, syncope, snoring, daytime somnolence, bleeding, or neurologic sequela. The patient is tolerating medications without difficulties and is otherwise without complaint today.    Atrial Fibrillation Risk Factors:  he does not have symptoms or diagnosis of sleep apnea. he does not have a history of rheumatic fever. he does have a history of alcohol  use. Mother has afib.  Atrial Fibrillation Management history:  Previous antiarrhythmic drugs: amiodarone   Previous cardioversions: 01/06/24 Previous ablations: MAZE 02/18/24 Anticoagulation history: Eliquis   ROS- All systems are reviewed and negative except as per the HPI above.  Past Medical History:  Diagnosis Date    Allergy    CAD (coronary artery disease)    Fracture of finger    Hyperlipidemia    Hypertension     Current Outpatient Medications  Medication Sig Dispense Refill   acetaminophen (TYLENOL) 500 MG tablet Taking 1-4 tablets by mouth for pain     amiodarone  (PACERONE ) 200 MG tablet Take 1 tablet (200 mg total) by mouth daily. 90 tablet 0   apixaban  (ELIQUIS ) 5 MG TABS tablet Take 1 tablet (5 mg total) by mouth 2 (two) times daily. 60 tablet 5   aspirin  EC 81 MG tablet Take 81 mg by mouth daily. Swallow whole.     ezetimibe  (ZETIA ) 10 MG tablet TAKE 1 TABLET(10 MG) BY MOUTH DAILY 30 tablet 0   furosemide  (LASIX ) 20 MG tablet Take 1 tablet (20 mg total) by mouth daily as needed (shortness of breath, swelling, or weight gain of 3-5 pounds). 30 tablet 1   MELATONIN PO Take by mouth as needed.     metoprolol  tartrate (LOPRESSOR ) 25 MG tablet Take 25 mg by mouth 2 (two) times daily.     oxyCODONE (OXY IR/ROXICODONE) 5 MG immediate release tablet Take 5-10 mg by mouth.     rosuvastatin  (CRESTOR ) 20 MG tablet TAKE 1 TABLET(20 MG) BY MOUTH DAILY 15 tablet 0   metoprolol  succinate (TOPROL  XL) 25 MG 24 hr tablet Take 1 tablet (25 mg total) by mouth daily. (Patient not taking: Reported on 02/25/2024) 90 tablet 2   No current facility-administered medications for this encounter.    Physical Exam: BP (!) 80/50   Pulse (!) 109   Ht 6' 1 (1.854 m)   Wt 85.3 kg   BMI 24.80  kg/m   GEN: Well nourished, well developed in no acute distress CARDIAC: Irregularly irregular rate and rhythm, no murmurs, rubs, gallops RESPIRATORY:  Clear to auscultation without rales, wheezing or rhonchi  ABDOMEN: Soft, non-tender, non-distended EXTREMITIES:  No edema; No deformity   Wt Readings from Last 3 Encounters:  02/25/24 85.3 kg  01/15/24 87.4 kg  12/23/23 90.7 kg     EKG Interpretation Date/Time:  Thursday February 25 2024 13:45:36 EST Ventricular Rate:  109 PR Interval:    QRS Duration:  90 QT  Interval:  354 QTC Calculation: 476 R Axis:   21  Text Interpretation: Atrial flutter with variable A-V block Nonspecific T wave abnormality Abnormal ECG When compared with ECG of 15-Jan-2024 07:59, Atrial flutter has replaced Sinus rhythm Confirmed by Ashanti Littles ([id number]) on 02/25/2024 1:49:20 PM    Echo 12/29/23 demonstrated   1. Left ventricular ejection fraction, by estimation, is 55 to 60%. The  left ventricle has normal function. The left ventricle has no regional  wall motion abnormalities. Left ventricular diastolic parameters are  indeterminate.   2. Right ventricular systolic function is normal. The right ventricular  size is normal. There is normal pulmonary artery systolic pressure.   3. Left atrial size was moderately dilated.   4. Right atrial size was moderately dilated.   5. Severe prolapse of posterior leaflet with severe eccentric MR. The  mitral valve is abnormal. Severe mitral valve regurgitation. No evidence  of mitral stenosis. There is severe holosystolic prolapse of multiple  scallops of the posterior leaflet of the   mitral valve.   6. The aortic valve is grossly normal. There is mild calcification of the  aortic valve. Aortic valve regurgitation is not visualized. No aortic  stenosis is present.   7. The inferior vena cava is dilated in size with <50% respiratory  variability, suggesting right atrial pressure of 15 mmHg.   Comparison(s): No prior Echocardiogram.   Conclusion(s)/Recommendation(s): Severe eccentric mitral regurgitation due to prolapse of posterior mitral valve leaflet. Recommend TEE for further  evaluation. Findings communicated to Dayna Dunn, PA.    CHA2DS2-VASc Score = 1  The patient's score is based upon: CHF History: 0 HTN History: 0 Diabetes History: 0 Stroke History: 0 Vascular Disease History: 1 Age Score: 0 Gender Score: 0       ASSESSMENT AND PLAN: Persistent Atrial Fibrillation/atrial flutter (ICD10:  I48.19) The  patient's CHA2DS2-VASc score is 1, indicating a 0.6% annual risk of stroke.   S/p MAZE 02/18/24 He is in symptomatic atrial flutter today.  We discussed rhythm control options today. Given his symptoms and low BP, will plan for TEE guided DCCV since his Eliquis  was resumed on 12/16. He did have LAA clipped during surgery, will evaluate for leak and residual stump, d/w primary cardiologist.  Continue amiodarone  200 mg BID for today and tomorrow then decrease back to once daily. Continue Eliquis  5 mg BID Continue Lopressor  25 mg BID   High Risk Medication Monitoring (ICD 10: Z79.899) Patient requires ongoing monitoring for anti-arrhythmic medication which has the potential to cause life threatening arrhythmias. Intervals on ECG acceptable for amiodarone  monitoring.   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    Informed Consent   Shared Decision Making/Informed Consent   The risks [stroke, cardiac arrhythmias rarely resulting in the need for a temporary or permanent pacemaker, skin irritation or burns,  esophageal damage, perforation (1:10,000 risk), bleeding, pharyngeal hematoma as well as other potential complications associated with conscious sedation including aspiration, arrhythmia, respiratory failure and death], benefits (treatment guidance, restoration of normal sinus rhythm, diagnostic support) and alternatives of a transesophageal echocardiogram guided cardioversion were discussed in detail with Mr. Ealy and he is willing to proceed.     [facility] [address] [place], [place] [address] [phone] "

## 2024-02-25 NOTE — Telephone Encounter (Signed)
 Patient c/o Palpitations:  STAT if patient reporting lightheadedness, shortness of breath, or chest pain  How long have you had palpitations/irregular HR/ Afib? Are you having the symptoms now? Last night  Are you currently experiencing lightheadedness, SOB or CP? SOB   Do you have a history of afib (atrial fibrillation) or irregular heart rhythm? yes  Have you checked your BP or HR? (document readings if available): 100,115   Are you experiencing any other symptoms? no

## 2024-02-25 NOTE — Telephone Encounter (Signed)
 Pt got disconnected during transfer. Message left for pt to call back.

## 2024-02-25 NOTE — H&P (View-Only) (Signed)
 "   Primary Care Physician: Onita Rush, MD Primary Cardiologist: Oneil Parchment, MD Electrophysiologist: None  Referring Physician: Dr Parchment Elsie Perone is a 63 y.o. male with a history of CAD, HLD, VHD, atrial fibrillation who presents for follow up in the [facility].  The patient was initially diagnosed with atrial fibrillation 12/23/23 at a visit with Dayna Dunn. He had symptoms of dyspnea on exertion. He was started on Toprol  for rate control and Eliquis  for stroke prevention. He underwent TEE guided DCCV on 01/06/24. TEE showed severe MR with flail P2 segment. He was loaded on amiodarone . He underwent MV repair, MAZE, and LAAL at [facility] on 02/18/24.   He called [facility] with elevated heart rates and SOB this AM. His Toprol  was changed to Lopressor  and amiodarone  was increased to BID.    Patient presents today for follow up for atrial fibrillation and amiodarone  monitoring. He is in atrial flutter with mildly elevated rates. He has had some intermittent dizziness. There were no specific triggers that he could identify. No bleeding issues since resuming anticoagulation on 02/23/24.   Today, he denies symptoms of chest pain, shortness of breath, orthopnea, PND, lower extremity edema, presyncope, syncope, snoring, daytime somnolence, bleeding, or neurologic sequela. The patient is tolerating medications without difficulties and is otherwise without complaint today.    Atrial Fibrillation Risk Factors:  he does not have symptoms or diagnosis of sleep apnea. he does not have a history of rheumatic fever. he does have a history of alcohol  use. Mother has afib.  Atrial Fibrillation Management history:  Previous antiarrhythmic drugs: amiodarone   Previous cardioversions: 01/06/24 Previous ablations: MAZE 02/18/24 Anticoagulation history: Eliquis   ROS- All systems are reviewed and negative except as per the HPI above.  Past Medical History:  Diagnosis Date    Allergy    CAD (coronary artery disease)    Fracture of finger    Hyperlipidemia    Hypertension     Current Outpatient Medications  Medication Sig Dispense Refill   acetaminophen (TYLENOL) 500 MG tablet Taking 1-4 tablets by mouth for pain     amiodarone  (PACERONE ) 200 MG tablet Take 1 tablet (200 mg total) by mouth daily. 90 tablet 0   apixaban  (ELIQUIS ) 5 MG TABS tablet Take 1 tablet (5 mg total) by mouth 2 (two) times daily. 60 tablet 5   aspirin  EC 81 MG tablet Take 81 mg by mouth daily. Swallow whole.     ezetimibe  (ZETIA ) 10 MG tablet TAKE 1 TABLET(10 MG) BY MOUTH DAILY 30 tablet 0   furosemide  (LASIX ) 20 MG tablet Take 1 tablet (20 mg total) by mouth daily as needed (shortness of breath, swelling, or weight gain of 3-5 pounds). 30 tablet 1   MELATONIN PO Take by mouth as needed.     metoprolol  tartrate (LOPRESSOR ) 25 MG tablet Take 25 mg by mouth 2 (two) times daily.     oxyCODONE (OXY IR/ROXICODONE) 5 MG immediate release tablet Take 5-10 mg by mouth.     rosuvastatin  (CRESTOR ) 20 MG tablet TAKE 1 TABLET(20 MG) BY MOUTH DAILY 15 tablet 0   metoprolol  succinate (TOPROL  XL) 25 MG 24 hr tablet Take 1 tablet (25 mg total) by mouth daily. (Patient not taking: Reported on 02/25/2024) 90 tablet 2   No current facility-administered medications for this encounter.    Physical Exam: BP (!) 80/50   Pulse (!) 109   Ht 6' 1 (1.854 m)   Wt 85.3 kg   BMI 24.80  kg/m   GEN: Well nourished, well developed in no acute distress CARDIAC: Irregularly irregular rate and rhythm, no murmurs, rubs, gallops RESPIRATORY:  Clear to auscultation without rales, wheezing or rhonchi  ABDOMEN: Soft, non-tender, non-distended EXTREMITIES:  No edema; No deformity   Wt Readings from Last 3 Encounters:  02/25/24 85.3 kg  01/15/24 87.4 kg  12/23/23 90.7 kg     EKG Interpretation Date/Time:  Thursday February 25 2024 13:45:36 EST Ventricular Rate:  109 PR Interval:    QRS Duration:  90 QT  Interval:  354 QTC Calculation: 476 R Axis:   21  Text Interpretation: Atrial flutter with variable A-V block Nonspecific T wave abnormality Abnormal ECG When compared with ECG of 15-Jan-2024 07:59, Atrial flutter has replaced Sinus rhythm Confirmed by Ashanti Littles ([id number]) on 02/25/2024 1:49:20 PM    Echo 12/29/23 demonstrated   1. Left ventricular ejection fraction, by estimation, is 55 to 60%. The  left ventricle has normal function. The left ventricle has no regional  wall motion abnormalities. Left ventricular diastolic parameters are  indeterminate.   2. Right ventricular systolic function is normal. The right ventricular  size is normal. There is normal pulmonary artery systolic pressure.   3. Left atrial size was moderately dilated.   4. Right atrial size was moderately dilated.   5. Severe prolapse of posterior leaflet with severe eccentric MR. The  mitral valve is abnormal. Severe mitral valve regurgitation. No evidence  of mitral stenosis. There is severe holosystolic prolapse of multiple  scallops of the posterior leaflet of the   mitral valve.   6. The aortic valve is grossly normal. There is mild calcification of the  aortic valve. Aortic valve regurgitation is not visualized. No aortic  stenosis is present.   7. The inferior vena cava is dilated in size with <50% respiratory  variability, suggesting right atrial pressure of 15 mmHg.   Comparison(s): No prior Echocardiogram.   Conclusion(s)/Recommendation(s): Severe eccentric mitral regurgitation due to prolapse of posterior mitral valve leaflet. Recommend TEE for further  evaluation. Findings communicated to Dayna Dunn, PA.    CHA2DS2-VASc Score = 1  The patient's score is based upon: CHF History: 0 HTN History: 0 Diabetes History: 0 Stroke History: 0 Vascular Disease History: 1 Age Score: 0 Gender Score: 0       ASSESSMENT AND PLAN: Persistent Atrial Fibrillation/atrial flutter (ICD10:  I48.19) The  patient's CHA2DS2-VASc score is 1, indicating a 0.6% annual risk of stroke.   S/p MAZE 02/18/24 He is in symptomatic atrial flutter today.  We discussed rhythm control options today. Given his symptoms and low BP, will plan for TEE guided DCCV since his Eliquis  was resumed on 12/16. He did have LAA clipped during surgery, will evaluate for leak and residual stump, d/w primary cardiologist.  Continue amiodarone  200 mg BID for today and tomorrow then decrease back to once daily. Continue Eliquis  5 mg BID Continue Lopressor  25 mg BID   High Risk Medication Monitoring (ICD 10: Z79.899) Patient requires ongoing monitoring for anti-arrhythmic medication which has the potential to cause life threatening arrhythmias. Intervals on ECG acceptable for amiodarone  monitoring.   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    Informed Consent   Shared Decision Making/Informed Consent   The risks [stroke, cardiac arrhythmias rarely resulting in the need for a temporary or permanent pacemaker, skin irritation or burns,  esophageal damage, perforation (1:10,000 risk), bleeding, pharyngeal hematoma as well as other potential complications associated with conscious sedation including aspiration, arrhythmia, respiratory failure and death], benefits (treatment guidance, restoration of normal sinus rhythm, diagnostic support) and alternatives of a transesophageal echocardiogram guided cardioversion were discussed in detail with Mr. Ealy and he is willing to proceed.     [facility] [address] [place], [place] [address] [phone] "

## 2024-02-26 ENCOUNTER — Ambulatory Visit (HOSPITAL_COMMUNITY)

## 2024-02-26 ENCOUNTER — Ambulatory Visit (HOSPITAL_COMMUNITY)
Admission: RE | Admit: 2024-02-26 | Discharge: 2024-02-26 | Disposition: A | Discharge status: Home / Self Care | Attending: Internal Medicine | Admitting: Internal Medicine

## 2024-02-26 ENCOUNTER — Ambulatory Visit (HOSPITAL_COMMUNITY): Admitting: Anesthesiology

## 2024-02-26 ENCOUNTER — Other Ambulatory Visit: Payer: Self-pay

## 2024-02-26 ENCOUNTER — Encounter (HOSPITAL_COMMUNITY): Admission: RE | Disposition: A | Payer: Self-pay | Source: Home / Self Care | Attending: Internal Medicine

## 2024-02-26 DIAGNOSIS — I4819 Other persistent atrial fibrillation: Secondary | ICD-10-CM | POA: Diagnosis not present

## 2024-02-26 DIAGNOSIS — I4891 Unspecified atrial fibrillation: Secondary | ICD-10-CM

## 2024-02-26 DIAGNOSIS — I2510 Atherosclerotic heart disease of native coronary artery without angina pectoris: Secondary | ICD-10-CM | POA: Diagnosis not present

## 2024-02-26 DIAGNOSIS — E785 Hyperlipidemia, unspecified: Secondary | ICD-10-CM | POA: Diagnosis not present

## 2024-02-26 DIAGNOSIS — Z79899 Other long term (current) drug therapy: Secondary | ICD-10-CM | POA: Diagnosis not present

## 2024-02-26 DIAGNOSIS — I4892 Unspecified atrial flutter: Secondary | ICD-10-CM | POA: Diagnosis not present

## 2024-02-26 DIAGNOSIS — Z7901 Long term (current) use of anticoagulants: Secondary | ICD-10-CM | POA: Diagnosis not present

## 2024-02-26 DIAGNOSIS — I340 Nonrheumatic mitral (valve) insufficiency: Secondary | ICD-10-CM

## 2024-02-26 DIAGNOSIS — I10 Essential (primary) hypertension: Secondary | ICD-10-CM | POA: Insufficient documentation

## 2024-02-26 DIAGNOSIS — Z7982 Long term (current) use of aspirin: Secondary | ICD-10-CM | POA: Insufficient documentation

## 2024-02-26 DIAGNOSIS — I341 Nonrheumatic mitral (valve) prolapse: Secondary | ICD-10-CM | POA: Insufficient documentation

## 2024-02-26 HISTORY — PX: TRANSESOPHAGEAL ECHOCARDIOGRAM (CATH LAB): EP1270

## 2024-02-26 HISTORY — PX: CARDIOVERSION: EP1203

## 2024-02-26 LAB — ECHO TEE
MV M vel: 5.36 m/s
MV Peak grad: 114.9 mmHg
Radius: 0.5 cm

## 2024-02-26 SURGERY — CARDIOVERSION (CATH LAB)
Anesthesia: General

## 2024-02-26 MED ORDER — PROPOFOL 10 MG/ML IV BOLUS
INTRAVENOUS | Status: DC | PRN
  Administered 2024-02-26: 60 mg via INTRAVENOUS
  Administered 2024-02-26: 40 mg via INTRAVENOUS
  Administered 2024-02-26: 30 mg via INTRAVENOUS

## 2024-02-26 MED ORDER — PROPOFOL 500 MG/50ML IV EMUL
INTRAVENOUS | Status: DC | PRN
  Administered 2024-02-26: 120 ug/kg/min via INTRAVENOUS

## 2024-02-26 MED ORDER — SODIUM CHLORIDE 0.9 % IV SOLN: INTRAVENOUS | Status: DC

## 2024-02-26 MED ORDER — LIDOCAINE 2% (20 MG/ML) 5 ML SYRINGE
INTRAMUSCULAR | Status: DC | PRN
  Administered 2024-02-26: 100 mg via INTRAVENOUS

## 2024-02-26 MED ORDER — PHENYLEPHRINE 80 MCG/ML (10ML) SYRINGE FOR IV PUSH (FOR BLOOD PRESSURE SUPPORT)
PREFILLED_SYRINGE | INTRAVENOUS | Status: DC | PRN
  Administered 2024-02-26 (×5): 160 ug via INTRAVENOUS

## 2024-02-26 SURGICAL SUPPLY — 1 items: PAD DEFIB RADIO PHYSIO CONN (PAD) ×1 IMPLANT

## 2024-02-26 NOTE — Anesthesia Preprocedure Evaluation (Signed)
"                                    Anesthesia Evaluation  Patient identified by MRN, date of birth, ID band Patient awake    Reviewed: Allergy & Precautions, H&P , NPO status , Patient's Chart, lab work & pertinent test results, reviewed documented beta blocker date and time   Airway Mallampati: II  TM Distance: >3 FB Neck ROM: Full    Dental no notable dental hx. (+) Teeth Intact, Dental Advisory Given   Pulmonary neg pulmonary ROS   Pulmonary exam normal breath sounds clear to auscultation       Cardiovascular hypertension, Pt. on medications + CAD  + dysrhythmias Atrial Fibrillation  Rhythm:Irregular Rate:Normal     Neuro/Psych negative neurological ROS  negative psych ROS   GI/Hepatic negative GI ROS, Neg liver ROS,,,  Endo/Other  negative endocrine ROS    Renal/GU negative Renal ROS  negative genitourinary   Musculoskeletal   Abdominal   Peds  Hematology negative hematology ROS (+)   Anesthesia Other Findings   Reproductive/Obstetrics negative OB ROS                              Anesthesia Physical Anesthesia Plan  ASA: 3  Anesthesia Plan: General   Post-op Pain Management: Minimal or no pain anticipated   Induction: Intravenous  PONV Risk Score and Plan: 2 and Propofol  infusion  Airway Management Planned: Natural Airway and Simple Face Mask  Additional Equipment:   Intra-op Plan:   Post-operative Plan:   Informed Consent: I have reviewed the patients History and Physical, chart, labs and discussed the procedure including the risks, benefits and alternatives for the proposed anesthesia with the patient or authorized representative who has indicated his/her understanding and acceptance.     Dental advisory given  Plan Discussed with: CRNA  Anesthesia Plan Comments:         Anesthesia Quick Evaluation  "

## 2024-02-26 NOTE — Interval H&P Note (Signed)
 History and Physical Interval Note:  02/26/2024 9:44 AM  Eric Dixon  has presented today for surgery, with the diagnosis of AFIB.  The various methods of treatment have been discussed with the patient and family. After consideration of risks, benefits and other options for treatment, the patient has consented to  Procedures: CARDIOVERSION (N/A) TRANSESOPHAGEAL ECHOCARDIOGRAM (N/A) as a surgical intervention.  The patient's history has been reviewed, patient examined, no change in status, stable for surgery.  I have reviewed the patient's chart and labs.  Questions were answered to the patient's satisfaction.     [name]

## 2024-02-26 NOTE — Anesthesia Postprocedure Evaluation (Signed)
"   Anesthesia Post Note  Patient: Eric Dixon  Procedure(s) Performed: CARDIOVERSION TRANSESOPHAGEAL ECHOCARDIOGRAM     Patient location during evaluation: Cath Lab Anesthesia Type: General Level of consciousness: awake and alert Pain management: pain level controlled Vital Signs Assessment: post-procedure vital signs reviewed and stable Respiratory status: spontaneous breathing, nonlabored ventilation and respiratory function stable Cardiovascular status: blood pressure returned to baseline and stable Postop Assessment: no apparent nausea or vomiting Anesthetic complications: no   There were no known notable events for this encounter.  Last Vitals:  Vitals:   02/26/24 1045 02/26/24 1100  BP: 138/79 96/68  Pulse: (!) 57 (!) 58  Resp: 15 (!) 23  Temp:  36.6 C  SpO2: 98% 96%    Last Pain:  Vitals:   02/26/24 1100  TempSrc: Temporal  PainSc: 0-No pain                 Turhan Chill,W. EDMOND      "

## 2024-02-26 NOTE — CV Procedure (Signed)
 "   TEE/CARDIOVERSION NOTE  TRANSESOPHAGEAL ECHOCARDIOGRAM (TEE):  Indictation: Atrial Fibrillation and recent MV repair/MAZE/LAAL  Consent:   Informed consent was obtained prior to the procedure. The risks, benefits and alternatives for the procedure were discussed and the patient comprehended these risks.  Risks include, but are not limited to, cough, sore throat, vomiting, nausea, somnolence, esophageal and stomach trauma or perforation, bleeding, low blood pressure, aspiration, pneumonia, infection, trauma to the teeth and death.    Time Out: Verified patient identification, verified procedure, site/side was marked, verified correct patient position, special equipment/implants available, medications/allergies/relevent history reviewed, required imaging and test results available. Performed  Procedure:  After a procedural time-out, the patient was given propofol  per anesthesia for sedation. See their separate report for details. The patient's heart rate, blood pressure, and oxygen saturation are monitored continuously during the procedure. The oropharynx was anesthetized with topical cetacaine.  The transesophageal probe was inserted in the esophagus and stomach without difficulty and multiple views were obtained. Agitated microbubble saline contrast was not administered.  Complications:    Complications: None Patient did tolerate procedure well.  Findings:  LEFT VENTRICLE: The left ventricular wall thickness is normal.  The left ventricular cavity is normal in size. Wall motion is normal.  LVEF is 50-55%.  RIGHT VENTRICLE:  The right ventricle is normal in structure and function without any thrombus or masses.    LEFT ATRIUM:  The left atrium is mildly dilated in size without any thrombus or masses.  There is not spontaneous echo contrast (smoke) in the left atrium consistent with a low flow state.  LEFT ATRIAL APPENDAGE:  The left atrial appendage is free of any thrombus or  masses. The appendage has single lobes. Pulse doppler indicates moderate flow in the appendage. The appendage was described as recently over-sewn, however, the ostium appears widely patent. Color doppler fills the entire appendage which was confirmed to have no thrombus on multiplanar imaging.  ATRIAL SEPTUM:  The atrial septum is aneurysmal.  There is no evidence for interatrial shunting by color doppler.  RIGHT ATRIUM:  The right atrium is normal in size and function without any thrombus or masses.  MITRAL VALVE:  The mitral valve has been previously repaired with a  40 mm Simulus semi-rigid band annuloplasty and gore-tex artificial cords to P2 x 3. There is no stenosis. There is Mild regurgitation.  There were no vegetations. This was well-visualized in 2D/3D modes.  AORTIC VALVE:  The aortic valve is trileaflet, normal in structure and function with no regurgitation.  There were no vegetations or stenosis  TRICUSPID VALVE:  The tricuspid valve is normal in structure and function with trivial regurgitation.  There were no vegetations or stenosis   PULMONIC VALVE:  The pulmonic valve is normal in structure and function with no regurgitation.  There were no vegetations or stenosis.   AORTIC ARCH, ASCENDING AND DESCENDING AORTA:  There was no Shaune et. Al, 1992) atherosclerosis of the ascending aorta, aortic arch, or proximal descending aorta.  12. PULMONARY VEINS: Anomalous pulmonary venous return was not noted.  13. PERICARDIUM: The pericardium appeared normal and non-thickened.  There is no pericardial effusion.  CARDIOVERSION:     Second Time Out: Verified patient identification, verified procedure, site/side was marked, verified correct patient position, special equipment/implants available, medications/allergies/relevent history reviewed, required imaging and test results available.  Performed  Procedure:  Patient placed on cardiac monitor, pulse oximetry, supplemental oxygen as  necessary.  Sedation administered per anesthesia Pacer pads placed anterior  and posterior chest. Cardioverted 1 time(s).  Cardioverted at 200J biphasic.  Complications:  Complications: None Patient did tolerate procedure well.  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  Recommendations:  Follow-up as scheduled with Katlyn West,NP and Dr. Jeffrie as well as Dr. Ricky at [facility].  Time Spent Directly with the Patient:  60 minutes   Eric KYM Maxcy, MD, [facility], FNLA, FACP  [place]  [facility]  Medical Director of the Advanced Lipid Disorders &  Cardiovascular Risk Reduction Clinic Diplomate of the American Board of Clinical Lipidology Attending Cardiologist  Direct Dial: [phone]  Fax: [phone]  Website:  [email or web address] Eric Dixon Eric Dixon 02/26/2024, 10:43 AM  "

## 2024-02-26 NOTE — Transfer of Care (Signed)
 Immediate Anesthesia Transfer of Care Note  Patient: Eric Dixon  Procedure(s) Performed: CARDIOVERSION TRANSESOPHAGEAL ECHOCARDIOGRAM  Patient Location: Cath Lab  Anesthesia Type:MAC  Level of Consciousness: drowsy and responds to stimulation  Airway & Oxygen Therapy: Patient Spontanous Breathing  Post-op Assessment: Report given to RN and Post -op Vital signs reviewed and stable  Post vital signs: Reviewed and stable  Last Vitals:  Vitals Value Taken Time  BP 138/79 (81)   Temp    Pulse 56   Resp 18   SpO2 99     Last Pain:  Vitals:   02/26/24 0933  TempSrc: Temporal         Complications: There were no known notable events for this encounter.

## 2024-02-28 ENCOUNTER — Encounter (HOSPITAL_COMMUNITY): Payer: Self-pay | Admitting: Internal Medicine

## 2024-03-02 ENCOUNTER — Other Ambulatory Visit (HOSPITAL_COMMUNITY): Payer: Self-pay

## 2024-03-06 ENCOUNTER — Encounter: Payer: Self-pay | Admitting: Cardiology

## 2024-03-07 MED ORDER — METOPROLOL SUCCINATE ER 50 MG PO TB24: 50.0000 mg | ORAL_TABLET | Freq: Every day | ORAL | 3 refills | Status: DC

## 2024-03-07 NOTE — Addendum Note (Signed)
 Addended by: THEOTIS SHARLET PARAS on: 03/07/2024 04:04 PM   Modules accepted: Orders

## 2024-03-07 NOTE — Telephone Encounter (Signed)
 Pt has been on Toprol  25 mg every day. Recent aflutter post-op and changed to Lopressor  twice daily by Dr in [place]. Pt asking if he can go back to Toprol  Rx. Asymptomatic and no complaints at this time, other than pt states he would prefer the once a day pill and thought this Lopressor  was temporary. Will send to Dr. Jeffrie and Dayna Dunn, PA for further recommendations.  Pt verbalizes understanding of plan.

## 2024-03-16 NOTE — Progress Notes (Unsigned)
 "  Cardiology Office Note    Date:  03/18/2024  ID:  Iain Sawchuk, DOB 17-Jul-1960, MRN [id number] PCP:  Onita Rush, MD  Cardiologist:  Oneil Parchment, MD  Electrophysiologist:  None   Chief Complaint: Follow up for MVR and atrial fibrillation   History of Present Illness: .   Adon Gehlhausen is a 64 y.o. male with visit-pertinent history of CAD by core CT, dyslipidemia with prior hypertriglyceridemia, family history of cardiovascular disease, mitral valve prolapse.   Coronary artery calcium  score was 816 in 05/2020, following patient was seen by Dr. Parchment with coronary CTA in 08/2020 showing coronary artery calcium  score 622, 95th percentile, mild disease in RCA, ramus and LAD, managed medically.  Patient is on rosuvastatin  and Zetia .   Patient was seen in clinic on 12/23/2023 by Raphael Bring, PA and found to be in new onset atrial fibrillation with heart rate in the high 90s.  Patient was not acutely aware but had noticed over the last year that his endurance had been down.  Patient had noted some increased shortness of breath during a trip in the summer with higher altitude however was able to ride his Peloton and remain active.  Patient was noted to have a systolic murmur suspicious for mitral regurgitation at the apex but was also heard throughout the precordium.  Echocardiogram on 12/29/2023 indicated LVEF 55 to 60%, no RWMA, diastolic parameters were indeterminate, RV systolic function and size was normal, normal PASP, LA was moderately dilated, RA was moderately dilated there was severe prolapse of the posterior leaflet with severe eccentric MR, no evidence of mitral valve stenosis, severe holosystolic prolapse of multiple scallops of the posterior leaflet of the mitral valve, aortic valve regurgitation was not visualized, no stenosis was present.   On 01/06/2024 patient had TEE guided DCCV.  TEE indicated LVEF 65% with flail P2 segment with severe eccentric mitral regurgitation with pulmonary vein  flow reversal, mild TR, normal bubble study no shunt.  Patient had successful cardioversion at 200 J.  Patient was loaded on oral amiodarone  given significant valve regurgitation. Coronary CTA on 11//2025 indicated minimal to mild nonobstructive CAD, coronary artery calcium  score 1391, placing the patient in the 97th percentile for age, race and sex matched control.   On 02/18/2024 patient underwent MVR, maze and left atrial appendage ligation with Dr. Ricky at [facility].  He was discharged home on 02/23/2024.  On 12/18 patient notified the office of elevated heart rates and shortness of breath, amiodarone  was increased to twice daily, was seen in A-fib clinic by [facility], [facility], he noted some slight dizziness, EKG indicated patient was in atrial flutter with variable AV block at 109 bpm.  On 12/19 patient underwent TEE guided DCCV.  TEE indicated LVEF 50 to 55%, LA was mildly dilated, no spontaneous echo contrast in left atrium, LA was free of any thrombus or masses, appendage was described as recently oversewn however ostium appeared widely patent.  Mitral valve had previously been repaired with a 40 mm stimulus semirigid band annuloplasty and Gore-Tex artificial cords to P2x3, there was no stenosis, mild regurgitation and no vegetations.  Patient was cardioverted 1 time at 200 J with successful DCCV.  He was seen in clinic at [facility] on 03/14/2024 for follow-up, noted dyspnea when walking up stairs and lightheaded when standing up quickly.  He denied any edema, chest pain, palpitations, fatigue.  He noted that activity level and tolerance have been improving since discharge.  Today he presents for follow-up.  He reports that he has been doing well overall.  He denies any chest pain, notes some residual soreness postsurgery, has noted some dyspnea on exertion that has been slowly improving since surgery.  He does note some fatigue associated with higher doses of metoprolol , reduced to 37.5 mg with improvement in  fatigue.  He denies any increased lower extremity edema, orthopnea or PND.  He denies any palpitations or feeling of atrial fibrillation.  He denies any bleeding problems on Eliquis .  He has started using his Peloton with low resistance, overall tolerates well.  Labwork independently reviewed: 03/14/2024: Hemoglobin 12.5, hematocrit 40.4, sodium 136, potassium 4.7, creatinine 1.1 ROS: .   Today he denies chest pain, lower extremity edema, palpitations, melena, hematuria, hemoptysis, diaphoresis, weakness, presyncope, syncope, orthopnea, and PND.  All other systems are reviewed and otherwise negative. Studies Reviewed: SABRA   EKG:  EKG is ordered today, personally reviewed, demonstrating  EKG Interpretation Date/Time:  Friday March 18 2024 09:19:15 EST Ventricular Rate:  75 PR Interval:  180 QRS Duration:  102 QT Interval:  364 QTC Calculation: 406 R Axis:   34  Text Interpretation: Normal sinus rhythm Incomplete right bundle branch block Nonspecific T wave abnormality When compared with ECG of 26-Feb-2024 11:09, Incomplete right bundle branch block is now Present Confirmed by Chioke Noxon [phone]) on 03/18/2024 12:59:37 PM   CV Studies: Cardiac studies reviewed are outlined and summarized above. Otherwise please see EMR for full report. Cardiac Studies & Procedures   ______________________________________________________________________________________________     ECHOCARDIOGRAM  ECHOCARDIOGRAM COMPLETE 12/29/2023  Narrative ECHOCARDIOGRAM REPORT    Patient Name:   DONYALE BERTHOLD Date of Exam: 12/29/2023 Medical Rec #:  [id number]      Height:       73.0 in Accession #:    [id number]     Weight:       200.0 lb Date of Birth:  March 22, 1960      BSA:          2.152 m Patient Age:    62 years       BP:           171/115 mmHg Patient Gender: M              HR:           113 bpm. Exam Location:  Outpatient  Procedure: 2D Echo, 3D Echo, Cardiac Doppler and Color Doppler (Both Spectral and  Color Flow Doppler were utilized during procedure).  Indications:    systolic murmur  History:        Patient has no prior history of Echocardiogram examinations. CAD, Arrythmias:Atrial Fibrillation; Risk Factors:Hypertension and Dyslipidemia.  Sonographer:    Philomena [name] RDCS Referring Phys: [id number] DAYNA N DUNN  IMPRESSIONS   1. Left ventricular ejection fraction, by estimation, is 55 to 60%. The left ventricle has normal function. The left ventricle has no regional wall motion abnormalities. Left ventricular diastolic parameters are indeterminate. 2. Right ventricular systolic function is normal. The right ventricular size is normal. There is normal pulmonary artery systolic pressure. 3. Left atrial size was moderately dilated. 4. Right atrial size was moderately dilated. 5. Severe prolapse of posterior leaflet with severe eccentric MR. The mitral valve is abnormal. Severe mitral valve regurgitation. No evidence of mitral stenosis. There is severe holosystolic prolapse of multiple scallops of the posterior leaflet of the mitral valve. 6. The aortic valve is grossly normal. There is mild calcification of the aortic valve. Aortic valve regurgitation is not visualized.  No aortic stenosis is present. 7. The inferior vena cava is dilated in size with <50% respiratory variability, suggesting right atrial pressure of 15 mmHg.  Comparison(s): No prior Echocardiogram.  Conclusion(s)/Recommendation(s): Severe eccentric mitral regurgitation due to prolapse of posterior mitral valve leaflet. Recommend TEE for further evaluation. Findings communicated to Dayna Dunn, PA.  FINDINGS Left Ventricle: Left ventricular ejection fraction, by estimation, is 55 to 60%. The left ventricle has normal function. The left ventricle has no regional wall motion abnormalities. The left ventricular internal cavity size was normal in size. There is borderline left ventricular hypertrophy. Left ventricular diastolic  parameters are indeterminate.  Right Ventricle: The right ventricular size is normal. No increase in right ventricular wall thickness. Right ventricular systolic function is normal. There is normal pulmonary artery systolic pressure. The tricuspid regurgitant velocity is 2.15 m/s, and with an assumed right atrial pressure of 15 mmHg, the estimated right ventricular systolic pressure is 33.5 mmHg.  Left Atrium: Left atrial size was moderately dilated.  Right Atrium: Right atrial size was moderately dilated.  Pericardium: There is no evidence of pericardial effusion.  Mitral Valve: Severe prolapse of posterior leaflet with severe eccentric MR. The mitral valve is abnormal. There is severe holosystolic prolapse of multiple scallops of the posterior leaflet of the mitral valve. There is mild thickening of the mitral valve leaflet(s). Severe mitral valve regurgitation. No evidence of mitral valve stenosis.  Tricuspid Valve: The tricuspid valve is normal in structure. Tricuspid valve regurgitation is mild . No evidence of tricuspid stenosis.  Aortic Valve: The aortic valve is grossly normal. There is mild calcification of the aortic valve. Aortic valve regurgitation is not visualized. No aortic stenosis is present.  Pulmonic Valve: The pulmonic valve was not well visualized. Pulmonic valve regurgitation is trivial. No evidence of pulmonic stenosis.  Aorta: The aortic root, ascending aorta, aortic arch and descending aorta are all structurally normal, with no evidence of dilitation or obstruction.  Venous: The inferior vena cava is dilated in size with less than 50% respiratory variability, suggesting right atrial pressure of 15 mmHg.  IAS/Shunts: The atrial septum is grossly normal.  Additional Comments: 3D was performed not requiring image post processing on an independent workstation and was normal.   LEFT VENTRICLE PLAX 2D LVIDd:         4.97 cm   Diastology LVIDs:         3.48 cm   LV  e' medial:    15.00 cm/s LV PW:         1.14 cm   LV E/e' medial:  10.8 LV IVS:        1.09 cm   LV e' lateral:   15.00 cm/s LVOT diam:     2.29 cm   LV E/e' lateral: 10.8 LV SV:         45 LV SV Index:   21 LVOT Area:     4.12 cm  3D Volume EF: 3D EF:        53 % LV EDV:       189 ml LV ESV:       89 ml LV SV:        101 ml  RIGHT VENTRICLE             IVC RV Basal diam:  3.11 cm     IVC diam: 2.27 cm RV Mid diam:    2.63 cm RV S prime:     10.20 cm/s TAPSE (M-mode): 1.7 cm  LEFT ATRIUM              Index        RIGHT ATRIUM           Index LA diam:        4.09 cm  1.90 cm/m   RA Area:     26.10 cm LA Vol (A2C):   104.0 ml 48.34 ml/m  RA Volume:   82.10 ml  38.16 ml/m LA Vol (A4C):   79.7 ml  37.04 ml/m LA Biplane Vol: 91.3 ml  42.43 ml/m AORTIC VALVE LVOT Vmax:   64.00 cm/s LVOT Vmean:  43.600 cm/s LVOT VTI:    0.109 m  AORTA Ao Root diam: 3.48 cm Ao Asc diam:  3.55 cm  MV E velocity: 162.00 cm/s  TRICUSPID VALVE TR Peak grad:   18.5 mmHg TR Vmax:        215.00 cm/s  SHUNTS Systemic VTI:  0.11 m Systemic Diam: 2.29 cm  Shelda Bruckner MD Electronically signed by Shelda Bruckner MD Signature Date/Time: 12/29/2023/5:48:01 PM    Final   TEE  ECHO TEE 02/26/2024  Narrative TRANSESOPHOGEAL ECHO REPORT    Patient Name:   NAVARRE DIANA Date of Exam: 02/26/2024 Medical Rec #:  [id number]      Height:       73.0 in Accession #:    [id number]     Weight:       188.0 lb Date of Birth:  [birth year]      BSA:          2.096 m Patient Age:    63 years       BP:           119/99 mmHg Patient Gender: M              HR:           76 bpm. Exam Location:  Inpatient  Procedure: Transesophageal Echo, Cardiac Doppler and Color Doppler (Both Spectral and Color Flow Doppler were utilized during procedure).  Indications:     Cardioversion  History:         Patient has prior history of Echocardiogram examinations, most recent 01/06/2024.  Mitral  Valve: 40 Simulus annuloplasty ring valve is present in the mitral position.  Sonographer:     Tinnie Gosling RDCS Referring Phys:  [id number] CLINT R FENTON Diagnosing Phys: Vinie Maxcy MD  PROCEDURE: After discussion of the risks and benefits of a TEE, an informed consent was obtained from the patient. The transesophogeal probe was passed without difficulty through the esophogus of the patient. Sedation performed by different physician. The patient was monitored while under deep sedation. Anesthestetic sedation was provided intravenously by Anesthesiology: 281.83mg  of Propofol , 100mg  of Lidocaine . The patient developed no complications during the procedure. A successful direct current cardioversion was performed at 200 joules with 1 attempt.  IMPRESSIONS   1. Left ventricular ejection fraction, by estimation, is 50 to 55%. The left ventricle has low normal function. 2. Right ventricular systolic function is normal. The right ventricular size is normal. 3. LAA ostium is widely patent. Left atrial size was mildly dilated. No left atrial/left atrial appendage thrombus was detected. 4. The mitral valve has been repaired/replaced. Mild mitral valve regurgitation. No evidence of mitral stenosis. There is a 40 Simulus annuloplasty ring present in the mitral position. 5. The aortic valve is tricuspid. Aortic valve regurgitation is not visualized. 6. 3D performed of the mitral valve and demonstrates 3D visualization shows annuloplasty  ring without stenosis and mild regurgitation.  Conclusion(s)/Recommendation(s): No LA/LAA thrombus identified. Successful cardioversion performed with restoration of normal sinus rhythm.  FINDINGS Left Ventricle: Left ventricular ejection fraction, by estimation, is 50 to 55%. The left ventricle has low normal function. The left ventricular internal cavity size was normal in size. There is no left ventricular hypertrophy.  Right Ventricle: The right ventricular size is  normal. No increase in right ventricular wall thickness. Right ventricular systolic function is normal.  Left Atrium: LAA ostium is widely patent. Left atrial size was mildly dilated. No left atrial/left atrial appendage thrombus was detected.  Right Atrium: Right atrial size was normal in size.  Pericardium: There is no evidence of pericardial effusion.  Mitral Valve: The mitral valve has been repaired/replaced. Mild mitral valve regurgitation. There is a 40 Simulus annuloplasty ring present in the mitral position. No evidence of mitral valve stenosis.  Tricuspid Valve: The tricuspid valve is grossly normal. Tricuspid valve regurgitation is trivial.  Aortic Valve: The aortic valve is tricuspid. Aortic valve regurgitation is not visualized.  Pulmonic Valve: The pulmonic valve was normal in structure. Pulmonic valve regurgitation is not visualized.  Aorta: The aortic root and ascending aorta are structurally normal, with no evidence of dilitation.  IAS/Shunts: The interatrial septum is aneurysmal. No atrial level shunt detected by color flow Doppler.  Additional Comments: 3D was performed not requiring image post processing on an independent workstation and was normal.  MR Peak grad:    114.9 mmHg MR Mean grad:    69.0 mmHg MR Vmax:         536.00 cm/s MR Vmean:        388.0 cm/s MR PISA:         1.57 cm MR PISA Eff ROA: 14 mm MR PISA Radius:  0.50 cm  Vinie Maxcy MD Electronically signed by Vinie Maxcy MD Signature Date/Time: 02/26/2024/11:08:47 AM    Final    CT SCANS  CT CORONARY MORPH W/CTA COR W/SCORE 01/12/2024  Addendum 01/19/2024  4:27 PM ADDENDUM REPORT: 01/19/2024 16:25  EXAM: OVER-READ INTERPRETATION  CT CHEST  The following report is an over-read performed by radiologist Dr. Andrea Gasman of [facility] on 01/19/2024. This over-read does not include interpretation of cardiac or coronary anatomy or pathology. The coronary CTA  interpretation by the cardiologist is attached.  COMPARISON:  Cardiac CT 08/22/2020  FINDINGS: Vascular: Minor descending aortic atherosclerosis. The included aorta is normal in caliber.  Mediastinum/nodes: No adenopathy or mass. Unremarkable esophagus.  Lungs: Mild breathing motion artifact in the lung bases. No pulmonary nodule. Small bilateral pleural effusions. Occasional areas of smooth septal thickening.  Upper abdomen: No acute findings. Small hypodensity in the central liver is too small to characterize, typically cyst.  Musculoskeletal: There are no acute or suspicious osseous abnormalities.  IMPRESSION: 1. Small bilateral pleural effusions. Occasional areas of smooth septal thickening, possible pulmonary edema. 2. Aortic Atherosclerosis (ICD10-I70.0).   Electronically Signed By: Andrea Gasman M.D. On: 01/19/2024 16:25  Narrative CLINICAL DATA:  64 yo male for CAD monitoring, abnormal stress imaging > 56yrs Pre-op noncardiac surgery, moderate risk  EXAM: Cardiac/Coronary CTA  TECHNIQUE: A non-contrast, gated CT scan was obtained with axial slices of 2.5 mm through the heart for calcium  scoring. Calcium  scoring was performed using the Agatston method. A 120 kV prospective, gated, contrast cardiac CT scan was obtained. Gantry rotation speed was 230 msec and collimation was 0.63 mm. Two sublingual nitroglycerin  tablets (0.8 mg) were given. The 3D data  set was reconstructed with motion correction for the best systolic or diastolic phase. Images were analyzed on a dedicated workstation using MPR, MIP, and VRT modes. The patient received 100mL OMNIPAQUE  IOHEXOL  350 MG/ML SOLN of contrast.  FINDINGS: Image quality: Excellent.  Noise artifact is: Limited.  Coronary arteries: Normal coronary origins.  Right dominance.  Right Coronary Artery: Dominant. Proximal and mid-vessel calcification with minimal 1-24% stenosis. Normal R-PLB and  R-PDA branches.  Left Main Coronary Artery: Short vessel, no disease. Bifurcates into the LAD and LCx arteries.  Left Anterior Descending Coronary Artery: Large anterior artery that wraps around the apex. Mild mixed 25-49% proximal stenosis. Small D1 branch, no disease.  Left Circumflex Artery: AV groove vessel, no disease. Large OM branch, minimal calcification without stenosis.  Aorta: Normal size, 35 mm at the mid ascending aorta (level of the PA bifurcation) measured double oblique. No calcifications. No dissection.  Aortic Valve: Trileaflet. No calcifications.  Other findings:  Normal pulmonary vein drainage into the left atrium.  Normal left atrial appendage without a thrombus.  Dilated main pulmonary artery to 30 mm, possibly suggesting pulmonary hypertension.  Extra-cardiac findings: See attached radiology report for non-cardiac structures.  IMPRESSION: 1. Minimal to mild non-obstructive CAD, CADRADS = 2.  2. Normal coronary origin with right dominance.  3. Coronary artery calcium  score is 1391, which places the patient in the 97th percentile for age/race and sex-matched controls (MESA).  4. Dilated main pulmonary artery to 30 mm, possibly suggesting pulmonary hypertension.  5. Continued aggressive cardiovascular risk factor modification is recommended.  RECOMMENDATIONS: 1. CAD-RADS 0: No evidence of CAD (0%). Consider non-atherosclerotic causes of chest pain.  2. CAD-RADS 1: Minimal non-obstructive CAD (0-24%). Consider non-atherosclerotic causes of chest pain. Consider preventive therapy and risk factor modification.  3. CAD-RADS 2: Mild non-obstructive CAD (25-49%). Consider non-atherosclerotic causes of chest pain. Consider preventive therapy and risk factor modification.  4. CAD-RADS 3: Moderate stenosis. Consider symptom-guided anti-ischemic pharmacotherapy as well as risk factor modification per guideline directed care. Additional analysis  with CT FFR will be submitted.  5. CAD-RADS 4: Severe stenosis. (70-99% or > 50% left main). Cardiac catheterization or CT FFR is recommended. Consider symptom-guided anti-ischemic pharmacotherapy as well as risk factor modification per guideline directed care. Invasive coronary angiography recommended with revascularization per published guideline statements.  6. CAD-RADS 5: Total coronary occlusion (100%). Consider cardiac catheterization or viability assessment. Consider symptom-guided anti-ischemic pharmacotherapy as well as risk factor modification per guideline directed care.  7. CAD-RADS N: Non-diagnostic study. Obstructive CAD can't be excluded. Alternative evaluation is recommended.  Electronically Signed: By: [name] M.D. On: 01/13/2024 10:20   CT SCANS  CT CORONARY FRACTIONAL FLOW RESERVE DATA PREP 08/22/2020  Narrative EXAM: FFRCT ANALYSIS  FINDINGS: FFRct analysis was performed on the original cardiac CT angiogram dataset. Diagrammatic representation of the FFRct analysis is provided in a separate PDF document in PACS. This dictation was created using the PDF document and an interactive 3D model of the results. 3D model is not available in the EMR/PACS. Normal FFR range is >0.80.  1. Left Main: No significant stenosis. LM FFR = 0.99.  2. LAD: No significant stenosis. Proximal FFR = 0.98, Mid FFR = 0.90, Distal FFR = 0.87.  3. LCX: FNo significant stenosis. Proximal FFR = 0.98, Mid FFR = 0.98, Distal FFR = 0.95.  4. RCA: No significant stenosis. Proximal FFR = 0.99, Mid FFR = 0.99, Distal FFR = 0.99.  IMPRESSION: 1.  Coronary CTA FFR analysis demonstrates no  flow limiting lesions.  Wilbert Bihari, MD   Electronically Signed By: Wilbert Bihari On: 08/22/2020 16:15   CT CORONARY MORPH W/CTA COR W/SCORE 08/22/2020  Addendum 08/22/2020  1:01 PM ADDENDUM REPORT: 08/22/2020 12:58  EXAM: Cardiac/Coronary  CT  TECHNIQUE: The patient was  scanned on a [employer].  FINDINGS: A 120 kV prospective scan was triggered in the descending thoracic aorta at 111 HU's. Axial non-contrast 3 mm slices were carried out through the heart. The data set was analyzed on a dedicated work station and scored using the Agatson method. Gantry rotation speed was 250 msecs and collimation was .6 mm. No beta blockade and 0.8 mg of sl NTG was given. The 3D data set was reconstructed in 5% intervals of the 67-82 % of the R-R cycle. Diastolic phases were analyzed on a dedicated work station using MPR, MIP and VRT modes. The patient received 80 cc of contrast.  Aorta:  Normal size.  No calcifications.  No dissection.  Aortic Valve:  Trileaflet.  No calcifications.  Coronary Arteries:  Normal coronary origin.  Right dominance.  RCA is a large dominant artery that gives rise to PDA and PLVB. There is mild calcified plaque in the proxmal RCA wtih associated stenosis of 25-49% There is minimal calcified plaque in the mid and distal RCA with associated stenosis of <25%.  Left main is a large artery that gives rise to LAD, Ramus and LCX arteries. There is no plaque.  Ramus is a moderate sized vessel with mild calcified plaque throughout the vessel with associated stenosis of 25-49%.  LAD is a large vessel that gives rise to 2 moderate sized diagonal branches. There is mild calcified plaque in the proximal and mid LAD with associated stenosis of 25-49%.  LCX is a non-dominant artery that gives rise to one large OM1 branch. There is no plaque.  Other findings:  Normal pulmonary vein drainage into the left atrium.  Normal let atrial appendage without a thrombus.  Normal size of the pulmonary artery.  IMPRESSION: 1. Coronary calcium  score of 622. This was 95th percentile for age and sex matched control.  2.  Mild atherosclerosis.  CAD RADS 2.  3.  Consider non atherosclerotic causes of chest pain.  4.  Recommend preventive  therapy and risk factor modification.  5.  This study has been submitted for FFR flow analysis.  Wilbert Bihari   Electronically Signed By: Wilbert Bihari On: 08/22/2020 12:58  Narrative EXAM: OVER-READ INTERPRETATION  CT CHEST  The following report is an over-read performed by radiologist Dr. Toribio Aye of [facility] on 08/22/2020. This over-read does not include interpretation of cardiac or coronary anatomy or pathology. The coronary CTA interpretation by the cardiologist is attached.  COMPARISON:  None.  FINDINGS: Within the visualized portions of the thorax there are no suspicious appearing pulmonary nodules or masses, there is no acute consolidative airspace disease, no pleural effusions, no pneumothorax and no lymphadenopathy. Visualized portions of the upper abdomen are unremarkable. There are no aggressive appearing lytic or blastic lesions noted in the visualized portions of the skeleton.  IMPRESSION: No significant incidental noncardiac findings are noted.  Electronically Signed: By: Toribio Aye M.D. On: 08/22/2020 10:13     ______________________________________________________________________________________________       Current Reported Medications:.    Active Medications[1]  Physical Exam:    VS:  BP 112/62   Pulse 75   Ht 6' 1 (1.854 m)   Wt 182 lb 9.6 oz (82.8  kg)   SpO2 97%   BMI 24.09 kg/m    Wt Readings from Last 3 Encounters:  03/18/24 182 lb 9.6 oz (82.8 kg)  02/25/24 188 lb (85.3 kg)  01/15/24 192 lb 9.6 oz (87.4 kg)    GEN: Well nourished, well developed in no acute distress NECK: No JVD; No carotid bruits CARDIAC: RRR, no murmurs, rubs, gallops RESPIRATORY:  Clear to auscultation without rales, wheezing or rhonchi  ABDOMEN: Soft, non-tender, non-distended EXTREMITIES:  No edema; No acute deformity     Asessement and Plan:SABRA    Mitral valve regurgitation/prolapse: TEE on 01/06/2024 indicated flail P2  segment with severe eccentric mitral regurgitation with pulmonary vein flow reversal. S/p MVR, MAZE, left atrial appendage ligation.  TEE on 12/19 indicated LVEF 50 to 55%, mitral valve repaired with no stenosis and mild regurgitation, no vegetations. Today he reports that he is overall doing well, has noted some dyspnea on exertion following surgery but has been slowly improving as well as some fatigue also improving. Discussed the use of prophylactic antibiotics before dental work. Prescription given for Amoxicillin  2 grams orally one hour before procedure.  Reviewed ED precautions. Continue  Will discuss with Dr. Jeffrie timing of repeat echocardiogram given recent TEE.  Persistent atrial fibrillation: S/p DCCV on 01/06/2024, had recurrent atrial fibrillation on 12/18, on 12/19 underwent TEE guided DCCV with successful conversion to normal sinus rhythm.  Today he denies any palpitations or feeling of atrial fibrillation or irregular heartbeats.  EKG indicates normal sinus rhythm.  He denies any bleeding problems on Eliquis .  Patient notes significant fatigue on metoprolol  succinate 50 mg daily, self reduced to 37.5 mg daily, tolerating better.  Will discuss with Dr. Jeffrie timing of discontinuation of amiodarone .  Check c-Met and TSH with reflex.  Continue amiodarone  200 mg daily, metoprolol  succinate 37.5 mg daily and Eliquis  5 mg twice daily.  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 Stable with no anginal symptoms. No indication for ischemic evaluation.  Heart healthy diet and regular cardiovascular exercise encouraged.    Hyperlipidemia: Patient notes that he is to have his annual physical completed in early March, discussed checking lipid profile today, he deferred at this time.  Continue Crestor .  {The patient has an active order for outpatient cardiac  rehabilitation.   Please indicate if the patient is ready to start. Do NOT delete this.  It will auto delete.  Refresh note, then sign.              Click here to document readiness and see contraindications.  :1}  Cardiac Rehabilitation Eligibility Assessment  The patient is ready to start cardiac rehabilitation from a cardiac standpoint. (Per notes from [facility] on 03/14/24 patient can start cardiac rehab)    Disposition: F/u with ***  Signed, Bernadett Milian D Mikah Rottinghaus, NP       [1]  Current Meds  Medication Sig   amiodarone  (PACERONE ) 200 MG tablet Take 1 tablet (200 mg total) by mouth daily. (Patient taking differently: Take 200-400 mg by mouth See admin instructions. 400mg  daily for 2 days beginning 02/25/24, then 1 tablet daily thereafter)   amoxicillin  (AMOXIL ) 500 MG capsule Take 4 capsules (2,000 mg total) by mouth as directed 1 hour prior to procedure.   apixaban  (ELIQUIS ) 5 MG TABS tablet Take 1 tablet (5 mg total) by mouth 2 (two) times daily.   aspirin  EC  81 MG tablet Take 81 mg by mouth daily. Swallow whole.   ezetimibe  (ZETIA ) 10 MG tablet TAKE 1 TABLET(10 MG) BY MOUTH DAILY   MELATONIN PO Take by mouth as needed.   rosuvastatin  (CRESTOR ) 20 MG tablet TAKE 1 TABLET(20 MG) BY MOUTH DAILY   [DISCONTINUED] metoprolol  succinate (TOPROL -XL) 50 MG 24 hr tablet Take 1 tablet (50 mg total) by mouth daily. Take with or immediately following a meal.   "

## 2024-03-18 ENCOUNTER — Ambulatory Visit: Attending: Cardiology | Admitting: Cardiology

## 2024-03-18 ENCOUNTER — Other Ambulatory Visit (HOSPITAL_COMMUNITY): Payer: Self-pay

## 2024-03-18 ENCOUNTER — Encounter: Payer: Self-pay | Admitting: Cardiology

## 2024-03-18 VITALS — BP 112/62 | HR 75 | Ht 73.0 in | Wt 182.6 lb

## 2024-03-18 DIAGNOSIS — D6869 Other thrombophilia: Secondary | ICD-10-CM

## 2024-03-18 DIAGNOSIS — I484 Atypical atrial flutter: Secondary | ICD-10-CM | POA: Diagnosis not present

## 2024-03-18 DIAGNOSIS — I340 Nonrheumatic mitral (valve) insufficiency: Secondary | ICD-10-CM | POA: Diagnosis not present

## 2024-03-18 DIAGNOSIS — I2510 Atherosclerotic heart disease of native coronary artery without angina pectoris: Secondary | ICD-10-CM

## 2024-03-18 DIAGNOSIS — Z79899 Other long term (current) drug therapy: Secondary | ICD-10-CM | POA: Diagnosis not present

## 2024-03-18 DIAGNOSIS — I4819 Other persistent atrial fibrillation: Secondary | ICD-10-CM | POA: Diagnosis not present

## 2024-03-18 DIAGNOSIS — E782 Mixed hyperlipidemia: Secondary | ICD-10-CM

## 2024-03-18 MED ORDER — AMOXICILLIN 500 MG PO CAPS
2000.0000 mg | ORAL_CAPSULE | ORAL | 3 refills | Status: AC
  Filled 2024-03-18: qty 4, 1d supply, fill #0

## 2024-03-18 MED ORDER — METOPROLOL SUCCINATE ER 25 MG PO TB24
37.5000 mg | ORAL_TABLET | Freq: Every day | ORAL | 3 refills | Status: AC
  Filled 2024-03-18: qty 45, 30d supply, fill #0

## 2024-03-18 NOTE — Patient Instructions (Signed)
 Medication Instructions:  START: Amoxicillin  for Prophylaxis for dental procedure. Take 2 grams (4x 500mg  tablets) 1 hour before procedure.   *If you need a refill on your cardiac medications before your next appointment, please call your pharmacy*  Lab Work: In 2 Weeks: CMET, TSH + FT4/FT3  If you have labs (blood work) drawn today and your tests are completely normal, you will receive your results only by: MyChart Message (if you have MyChart) OR A paper copy in the mail If you have any lab test that is abnormal or we need to change your treatment, we will call you to review the results.  Testing/Procedures: NONE  Follow-Up: At [facility], you and your health needs are our priority.  As part of our continuing mission to provide you with exceptional heart care, our providers are all part of one team.  This team includes your primary Cardiologist (physician) and Advanced Practice Providers or APPs (Physician Assistants and Nurse Practitioners) who all work together to provide you with the care you need, when you need it.  Your next appointment:   3 month(s)  Provider:   Oneil Parchment, MD

## 2024-03-20 ENCOUNTER — Other Ambulatory Visit (HOSPITAL_COMMUNITY): Payer: Self-pay | Admitting: *Deleted

## 2024-03-20 DIAGNOSIS — Z9889 Other specified postprocedural states: Secondary | ICD-10-CM

## 2024-03-31 LAB — COMPREHENSIVE METABOLIC PANEL WITH GFR
ALT: 43 IU/L (ref 0–44)
AST: 35 IU/L (ref 0–40)
Albumin: 4.5 g/dL (ref 3.9–4.9)
Alkaline Phosphatase: 65 IU/L (ref 47–123)
BUN/Creatinine Ratio: 15 (ref 10–24)
BUN: 17 mg/dL (ref 8–27)
Bilirubin Total: 0.5 mg/dL (ref 0.0–1.2)
CO2: 23 mmol/L (ref 20–29)
Calcium: 9.6 mg/dL (ref 8.6–10.2)
Chloride: 99 mmol/L (ref 96–106)
Creatinine, Ser: 1.12 mg/dL (ref 0.76–1.27)
Globulin, Total: 2.4 g/dL (ref 1.5–4.5)
Glucose: 126 mg/dL — ABNORMAL HIGH (ref 70–99)
Potassium: 4.6 mmol/L (ref 3.5–5.2)
Sodium: 139 mmol/L (ref 134–144)
Total Protein: 6.9 g/dL (ref 6.0–8.5)
eGFR: 74 mL/min/1.73

## 2024-03-31 LAB — TSH+T4F+T3FREE
Free T4: 1.41 ng/dL (ref 0.82–1.77)
T3, Free: 2.5 pg/mL (ref 2.0–4.4)
TSH: 1.65 u[IU]/mL (ref 0.450–4.500)

## 2024-04-01 ENCOUNTER — Ambulatory Visit: Payer: Self-pay | Admitting: Cardiology

## 2024-07-06 ENCOUNTER — Ambulatory Visit: Admitting: Cardiology
